# Patient Record
Sex: Male | Born: 1950
Health system: Southern US, Community
[De-identification: ages and names within clinical notes are randomized; demographics above are authoritative.]

## PROBLEM LIST (undated history)

## (undated) DIAGNOSIS — K7460 Unspecified cirrhosis of liver: Secondary | ICD-10-CM

## (undated) DIAGNOSIS — I10 Essential (primary) hypertension: Secondary | ICD-10-CM

## (undated) DIAGNOSIS — B1920 Unspecified viral hepatitis C without hepatic coma: Secondary | ICD-10-CM

## (undated) DIAGNOSIS — E119 Type 2 diabetes mellitus without complications: Secondary | ICD-10-CM

## (undated) HISTORY — PX: APPENDECTOMY: SHX54

## (undated) HISTORY — PX: SHOULDER ARTHROSCOPY: SHX128

---

## 2003-02-09 ENCOUNTER — Emergency Department (HOSPITAL_COMMUNITY): Admission: EM | Admit: 2003-02-09 | Discharge: 2003-02-09 | Payer: Self-pay | Admitting: Emergency Medicine

## 2006-04-30 ENCOUNTER — Emergency Department (HOSPITAL_COMMUNITY): Admission: EM | Admit: 2006-04-30 | Discharge: 2006-05-01 | Payer: Self-pay | Admitting: Emergency Medicine

## 2006-05-04 ENCOUNTER — Emergency Department (HOSPITAL_COMMUNITY): Admission: EM | Admit: 2006-05-04 | Discharge: 2006-05-04 | Payer: Self-pay | Admitting: Emergency Medicine

## 2007-11-29 ENCOUNTER — Ambulatory Visit: Payer: Self-pay | Admitting: Gastroenterology

## 2008-02-14 ENCOUNTER — Ambulatory Visit: Payer: Self-pay | Admitting: Gastroenterology

## 2008-02-20 ENCOUNTER — Ambulatory Visit (HOSPITAL_COMMUNITY): Admission: RE | Admit: 2008-02-20 | Discharge: 2008-02-20 | Payer: Self-pay | Admitting: Gastroenterology

## 2008-08-07 ENCOUNTER — Ambulatory Visit: Payer: Self-pay | Admitting: Gastroenterology

## 2008-10-16 ENCOUNTER — Ambulatory Visit (HOSPITAL_COMMUNITY): Admission: RE | Admit: 2008-10-16 | Discharge: 2008-10-16 | Payer: Self-pay | Admitting: Gastroenterology

## 2010-05-24 ENCOUNTER — Encounter: Payer: Self-pay | Admitting: Gastroenterology

## 2010-08-09 LAB — CREATININE, SERUM
Creatinine, Ser: 0.86 mg/dL (ref 0.4–1.5)
GFR calc Af Amer: >60 mL/min (ref >60)
GFR calc non Af Amer: >60 mL/min (ref >60)

## 2015-01-26 ENCOUNTER — Emergency Department (HOSPITAL_COMMUNITY)
Admission: EM | Admit: 2015-01-26 | Discharge: 2015-01-26 | Disposition: A | Discharge status: Home / Self Care | Payer: Medicare Other | Attending: Emergency Medicine | Admitting: Emergency Medicine

## 2015-01-26 ENCOUNTER — Emergency Department (HOSPITAL_COMMUNITY): Payer: Medicare Other

## 2015-01-26 ENCOUNTER — Encounter (HOSPITAL_COMMUNITY): Payer: Self-pay

## 2015-01-26 DIAGNOSIS — R160 Hepatomegaly, not elsewhere classified: Secondary | ICD-10-CM | POA: Insufficient documentation

## 2015-01-26 DIAGNOSIS — Z79899 Other long term (current) drug therapy: Secondary | ICD-10-CM | POA: Insufficient documentation

## 2015-01-26 DIAGNOSIS — R21 Rash and other nonspecific skin eruption: Secondary | ICD-10-CM | POA: Insufficient documentation

## 2015-01-26 DIAGNOSIS — Z794 Long term (current) use of insulin: Secondary | ICD-10-CM | POA: Diagnosis not present

## 2015-01-26 DIAGNOSIS — Z87891 Personal history of nicotine dependence: Secondary | ICD-10-CM | POA: Insufficient documentation

## 2015-01-26 DIAGNOSIS — E119 Type 2 diabetes mellitus without complications: Secondary | ICD-10-CM | POA: Insufficient documentation

## 2015-01-26 DIAGNOSIS — R1031 Right lower quadrant pain: Secondary | ICD-10-CM | POA: Insufficient documentation

## 2015-01-26 DIAGNOSIS — Z8719 Personal history of other diseases of the digestive system: Secondary | ICD-10-CM | POA: Insufficient documentation

## 2015-01-26 DIAGNOSIS — H539 Unspecified visual disturbance: Secondary | ICD-10-CM | POA: Insufficient documentation

## 2015-01-26 DIAGNOSIS — R109 Unspecified abdominal pain: Secondary | ICD-10-CM | POA: Diagnosis present

## 2015-01-26 DIAGNOSIS — Z8619 Personal history of other infectious and parasitic diseases: Secondary | ICD-10-CM | POA: Insufficient documentation

## 2015-01-26 DIAGNOSIS — R1011 Right upper quadrant pain: Secondary | ICD-10-CM | POA: Diagnosis not present

## 2015-01-26 DIAGNOSIS — Z9049 Acquired absence of other specified parts of digestive tract: Secondary | ICD-10-CM | POA: Insufficient documentation

## 2015-01-26 HISTORY — DX: Type 2 diabetes mellitus without complications: E11.9

## 2015-01-26 HISTORY — DX: Unspecified viral hepatitis C without hepatic coma: B19.20

## 2015-01-26 HISTORY — DX: Unspecified cirrhosis of liver: K74.60

## 2015-01-26 LAB — CBC WITH DIFFERENTIAL/PLATELET
Basophils Absolute: 0.1 10*3/uL (ref 0.0–0.1)
Basophils Relative: 1 %
EOS PCT: 4 %
Eosinophils Absolute: 0.3 10*3/uL (ref 0.0–0.7)
HEMATOCRIT: 40.2 % (ref 39.0–52.0)
HEMOGLOBIN: 13.8 g/dL (ref 13.0–17.0)
LYMPHS ABS: 2.8 10*3/uL (ref 0.7–4.0)
LYMPHS PCT: 37 %
MCH: 30.1 pg (ref 26.0–34.0)
MCHC: 34.3 g/dL (ref 30.0–36.0)
MCV: 87.8 fL (ref 78.0–100.0)
Monocytes Absolute: 0.7 10*3/uL (ref 0.1–1.0)
Monocytes Relative: 10 %
NEUTROS ABS: 3.8 10*3/uL (ref 1.7–7.7)
Neutrophils Relative %: 48 %
PLATELETS: 319 10*3/uL (ref 150–400)
RBC: 4.58 MIL/uL (ref 4.22–5.81)
RDW: 15 % (ref 11.5–15.5)
WBC: 7.6 10*3/uL (ref 4.0–10.5)

## 2015-01-26 LAB — HEPATIC FUNCTION PANEL
ALBUMIN: 3.4 g/dL — AB (ref 3.5–5.0)
ALT: 121 U/L — ABNORMAL HIGH (ref 17–63)
AST: 161 U/L — AB (ref 15–41)
Alkaline Phosphatase: 356 U/L — ABNORMAL HIGH (ref 38–126)
BILIRUBIN TOTAL: 1.5 mg/dL — AB (ref 0.3–1.2)
Bilirubin, Direct: 0.7 mg/dL — ABNORMAL HIGH (ref 0.1–0.5)
Indirect Bilirubin: 0.8 mg/dL (ref 0.3–0.9)
Total Protein: 9.3 g/dL — ABNORMAL HIGH (ref 6.5–8.1)

## 2015-01-26 LAB — URINALYSIS, ROUTINE W REFLEX MICROSCOPIC
Bilirubin Urine: NEGATIVE
Glucose, UA: NEGATIVE mg/dL
Hgb urine dipstick: NEGATIVE
Ketones, ur: NEGATIVE mg/dL
LEUKOCYTES UA: NEGATIVE
NITRITE: NEGATIVE
PROTEIN: NEGATIVE mg/dL
SPECIFIC GRAVITY, URINE: 1.016 (ref 1.005–1.030)
UROBILINOGEN UA: 1 mg/dL (ref 0.0–1.0)
pH: 5 (ref 5.0–8.0)

## 2015-01-26 LAB — BASIC METABOLIC PANEL
ANION GAP: 9 (ref 5–15)
BUN: 27 mg/dL — ABNORMAL HIGH (ref 6–20)
CALCIUM: 9.8 mg/dL (ref 8.9–10.3)
CO2: 21 mmol/L — ABNORMAL LOW (ref 22–32)
CREATININE: 1.49 mg/dL — AB (ref 0.61–1.24)
Chloride: 105 mmol/L (ref 101–111)
GFR, EST AFRICAN AMERICAN: 55 mL/min — AB (ref >60)
GFR, EST NON AFRICAN AMERICAN: 48 mL/min — AB (ref >60)
Glucose, Bld: 141 mg/dL — ABNORMAL HIGH (ref 65–99)
Potassium: 4.3 mmol/L (ref 3.5–5.1)
SODIUM: 135 mmol/L (ref 135–145)

## 2015-01-26 LAB — PROTIME-INR
INR: 1.05 (ref 0.00–1.49)
PROTHROMBIN TIME: 13.9 s (ref 11.6–15.2)

## 2015-01-26 LAB — LIPASE, BLOOD: Lipase: 29 U/L (ref 22–51)

## 2015-01-26 LAB — TROPONIN I: Troponin I: <0.03 ng/mL (ref <0.031)

## 2015-01-26 MED ORDER — HYDROXYZINE HCL 25 MG PO TABS: 25.0000 mg | ORAL_TABLET | Freq: Four times a day (QID) | ORAL | Status: DC | PRN

## 2015-01-26 MED ORDER — TRAMADOL HCL 50 MG PO TABS: 50.0000 mg | ORAL_TABLET | Freq: Four times a day (QID) | ORAL | Status: DC | PRN

## 2015-01-26 NOTE — Discharge Instructions (Signed)
Your ultrasound shows the possibility of a mass in your liver. Your doctor will need to order an MRI with and without contrast to further evaluate. There is a chance that this is cancer in her liver and needs to be followed up quickly.  Abdominal Pain Many things can cause abdominal pain. Usually, abdominal pain is not caused by a disease and will improve without treatment. It can often be observed and treated at home. Your health care provider will do a physical exam and possibly order blood tests and X-rays to help determine the seriousness of your pain. However, in many cases, more time must pass before a clear cause of the pain can be found. Before that point, your health care provider may not know if you need more testing or further treatment. HOME CARE INSTRUCTIONS  Monitor your abdominal pain for any changes. The following actions may help to alleviate any discomfort you are experiencing:  Only take over-the-counter or prescription medicines as directed by your health care provider.  Do not take laxatives unless directed to do so by your health care provider.  Try a clear liquid diet (broth, tea, or water) as directed by your health care provider. Slowly move to a bland diet as tolerated. SEEK MEDICAL CARE IF:  You have unexplained abdominal pain.  You have abdominal pain associated with nausea or diarrhea.  You have pain when you urinate or have a bowel movement.  You experience abdominal pain that wakes you in the night.  You have abdominal pain that is worsened or improved by eating food.  You have abdominal pain that is worsened with eating fatty foods.  You have a fever. SEEK IMMEDIATE MEDICAL CARE IF:   Your pain does not go away within 2 hours.  You keep throwing up (vomiting).  Your pain is felt only in portions of the abdomen, such as the right side or the left lower portion of the abdomen.  You pass bloody or black tarry stools. MAKE SURE YOU:  Understand these  instructions.   Will watch your condition.   Will get help right away if you are not doing well or get worse.  Document Released: 01/26/2005 Document Revised: 04/23/2013 Document Reviewed: 12/26/2012 [facility] Patient Information 2015 [place], [place]. This information is not intended to replace advice given to you by your health care provider. Make sure you discuss any questions you have with your health care provider. Liver Profile A liver profile is a battery of tests which helps your caregiver evaluate your liver function. The following tests are often included in the liver profile: Alanine aminotransferase (ALT or SGPT) This is an enzyme found primarily in the liver. Abnormalities may represent liver disease. This is found in cells of the liver so when it is elevated, it has been released by damaged cells. Albumin - The serum albumin is one of the major proteins in the blood and a reflection of the general state of nutrition. This is low when the liver is unable to do its job. It is also low when protein is lost in the urine. NORMAL FINDINGS Adult/Elderly  Total protein: 6.4-8.3 g/dL or 56-21 g/L (SI units)  Albumin: 3.5-5 g/dL or 30-86 g/L (SI units)  Globulin: 2.3-3.4 g/dL  Alpha1 globulin: 5.7-8.4 g/dL or 1-3 g/L (SI units)  Alpha2 globulin: 0.6-1 g/dL or 6-96 g/L (SI units)  Beta globulin: 0.7-1.1 g/dL or 2-95 g/L (SI units) Children  Total protein  Premature infant: 4.2-7.6 g/dL  Newborn: 2.8-4.1 g/dL  Infant: 3-2.4  g/dL  Child: 1.6-1 g/dL  Albumin  Premature infant: 3-4.2 g/dL  Newborn: 0.9-6.0 g/dL  Infant: 4.5-4.0 g/dL  Child: 9-8.1 g/dL Albumin/Globulin ratio - Calculated by dividing the albumin by the globulin. It is a measure of well being.  Alkaline phosphatase - This is an enzyme which is important in diagnosing proper bone and liver functions. NORMAL FINDINGS Age / Normal Value (units/L)  0-5 days / 35-140  Less than 3 yr / 15-60  3-6 yr /  15-50  6-12 yr / 10-50  12-18 yr / 10-40  Adult / 0-35 units/L or 0-0.58 microKat/L (SI Units) (Females tend to have slightly lower levels than males)  Elderly / Slightly higher than adults Aspartate aminotransferase (AST or SGOT) - an enzyme found in skeletal and heart muscle, liver and other organs. Abnormalities may represent liver disease. This is found in cells of the liver so when it is elevated, it has been released by damaged cells. Bilirubin, Total: A chemical involved with liver functions. High concentrations may result in jaundice. Jaundice is a yellowing of the skin and the whites of the eyes. NORMAL FINDINGS Blood  Adult/elderly/child  Total bilirubin: 0.3-1.0 mg/dL or 1.9-14 micromole/L (SI units)  Indirect bilirubin: 0.2-0.8 mg/dL or 7.8-29.5 micromole/L (SI units)  Direct bilirubin: 0.1-0.3 mg/dL or 6.2-1.3 micromole/L (SI units)  Newborn total bilirubin: 1.0-12.0 mg/dL or 08.6-578 micromole/L (SI units)  Urine0-0.02 mg/dL Ranges for normal findings may vary among different laboratories and hospitals. You should always check with your doctor after having lab work or other tests done to discuss the meaning of your test results and whether your values are considered within normal limits PREPARATION FOR TEST No preparation or fasting is necessary unless you have been informed otherwise. A blood sample is obtained by inserting a needle into a vein in the arm. MEANING OF TEST  Your caregiver will go over the test results with you and discuss the importance and meaning of your results, as well as treatment options and the need for additional tests if necessary. OBTAINING THE TEST RESULTS It is your responsibility to obtain your test results. Ask the lab or department performing the test when and how you will get your results. Document Released: 05/21/2004 Document Revised: 07/11/2011 Document Reviewed: 08/20/2013 [facility] Patient Information 2015 [place], [place]. This  information is not intended to replace advice given to you by your health care provider. Make sure you discuss any questions you have with your health care provider.

## 2015-01-26 NOTE — ED Provider Notes (Signed)
CSN: [id number]     Arrival date & time 01/26/15  0749 History   First MD Initiated Contact with Patient 01/26/15 0802     Chief Complaint  Patient presents with  . Abdominal Pain     (Consider location/radiation/quality/duration/timing/severity/associated sxs/prior Treatment) HPI Comments: Patient presents to the ER with multiple complaints. Patient reports that he has been having flashes in front of his eyes that seem like gnats flying around him. It happens for seconds and then goes away. He has not identified anything that has caused this. He is not experiencing blurred or double vision. There is no associated headache.  Patient also reports that he has been noticing increased skin tags and they're itchy.  Patient also complaining of abdominal pain. He has been having pain on the right side of his abdomen for 5 weeks. No associated fever, nausea, vomiting, diarrhea or constipation. He reports a history of hepatitis see, but it "is not active". Patient recently was told that his LFTs were elevated by his doctor. He was also told that his kidneys are strained. His doctor stopped his metformin because of this.  Patient is a 64 y.o. male presenting with abdominal pain.  Abdominal Pain   Past Medical History  Diagnosis Date  . Diabetes mellitus without complication   . Hepatitis C   . Cirrhosis of liver    Past Surgical History  Procedure Laterality Date  . Appendectomy    . Shoulder arthroscopy     No family history on file. Social History  Substance Use Topics  . Smoking status: Former Games developer  . Smokeless tobacco: None  . Alcohol Use: No    Review of Systems  Eyes: Positive for visual disturbance.  Gastrointestinal: Positive for abdominal pain.  Skin: Positive for rash.  All other systems reviewed and are negative.     Allergies  Review of patient's allergies indicates no known allergies.  Home Medications   Prior to Admission medications   Medication Sig Start  Date End Date Taking? Authorizing Provider  hydrOXYzine (VISTARIL) 25 MG capsule Take 25 mg by mouth 3 (three) times daily. 01/21/15  Yes Historical Provider, MD  LANTUS SOLOSTAR 100 UNIT/ML Solostar Pen Inject 60-80 Units into the skin 2 (two) times daily. 80 units in the morning, 60 units in the evening 01/01/15  Yes Historical Provider, MD  lisinopril-hydrochlorothiazide (PRINZIDE,ZESTORETIC) 20-25 MG per tablet Take 1 tablet by mouth daily. 01/01/15  Yes Historical Provider, MD  LYRICA 100 MG capsule Take 100 mg by mouth 3 (three) times daily. 01/15/15  Yes Historical Provider, MD  omeprazole (PRILOSEC) 40 MG capsule Take 40 mg by mouth daily. 01/14/15  Yes Historical Provider, MD   BP 123/80 mmHg  Pulse 80  Temp(Src) 97.7 F (36.5 C) (Oral)  Resp 17  SpO2 99% Physical Exam  Constitutional: He is oriented to person, place, and time. He appears well-developed and well-nourished. No distress.  HENT:  Head: Normocephalic and atraumatic.  Right Ear: Hearing normal.  Left Ear: Hearing normal.  Nose: Nose normal.  Mouth/Throat: Oropharynx is clear and moist and mucous membranes are normal.  Eyes: Conjunctivae and EOM are normal. Pupils are equal, round, and reactive to light.  Neck: Normal range of motion. Neck supple.  Cardiovascular: Regular rhythm, S1 normal and S2 normal.  Exam reveals no gallop and no friction rub.   No murmur heard. Pulmonary/Chest: Effort normal and breath sounds normal. No respiratory distress. He exhibits no tenderness.  Abdominal: Soft. Normal appearance and bowel sounds are  normal. There is no hepatosplenomegaly. There is tenderness in the right upper quadrant and right lower quadrant. There is no rebound, no guarding, no tenderness at McBurney's point and negative Murphy's sign. No hernia.  Musculoskeletal: Normal range of motion.  Neurological: He is alert and oriented to person, place, and time. He has normal strength. No cranial nerve deficit or sensory deficit.  Coordination normal. GCS eye subscore is 4. GCS verbal subscore is 5. GCS motor subscore is 6.  Skin: Skin is warm, dry and intact. No rash noted. No cyanosis.  Psychiatric: He has a normal mood and affect. His speech is normal and behavior is normal. Thought content normal.  Nursing note and vitals reviewed.   ED Course  Procedures (including critical care time) Labs Review Labs Reviewed  BASIC METABOLIC PANEL - Abnormal; Notable for the following:    CO2 21 (*)    Glucose, Bld 141 (*)    BUN 27 (*)    Creatinine, Ser 1.49 (*)    GFR calc non Af Amer 48 (*)    GFR calc Af Amer 55 (*)    All other components within normal limits  HEPATIC FUNCTION PANEL - Abnormal; Notable for the following:    Total Protein 9.3 (*)    Albumin 3.4 (*)    AST 161 (*)    ALT 121 (*)    Alkaline Phosphatase 356 (*)    Total Bilirubin 1.5 (*)    Bilirubin, Direct 0.7 (*)    All other components within normal limits  CBC WITH DIFFERENTIAL/PLATELET  TROPONIN I  LIPASE, BLOOD  URINALYSIS, ROUTINE W REFLEX MICROSCOPIC (NOT AT [facility])  PROTIME-INR    Imaging Review US Abdomen Limited Ruq  01/26/2015   CLINICAL DATA:  Elevated liver enzymes  EXAM: US ABDOMEN LIMITED - RIGHT UPPER QUADRANT  COMPARISON:  Abdominal ultrasound February 20, 2008; abdominal MRI October 16, 2008  FINDINGS: Gallbladder:  No gallstones or wall thickening visualized. There is no pericholecystic fluid. No sonographic Murphy sign noted.  Common bile duct:  Diameter: 5 mm. There is no intrahepatic or extrahepatic biliary duct dilatation.  Liver:  There is a solid mass arising in the caudate lobe region measuring 4.7 x 3.5 x 3.1 cm. Liver has a somewhat nodular contour with a mildly increased echotexture, concerning for underlying cirrhosis.  IMPRESSION: Mass arising from caudate lobe of liver. Underlying changes concerning for parenchymal disease/cirrhosis. Hepatocellular carcinoma must be of concern given these findings. Would advise pre and  and multiphasic post-contrast MR or CT of the liver to further evaluate. Study otherwise unremarkable.  These results were called by telephone at the time of interpretation on 01/26/2015 at 10:49 am to Dr. Jaci Carrel , who verbally acknowledged these results.   Electronically Signed   By: Bretta Bang III M.D.   On: 01/26/2015 10:50   I have personally reviewed and evaluated these images and lab results as part of my medical decision-making.   EKG Interpretation   Date/Time:  Monday January 26 2015 08:27:48 EDT Ventricular Rate:  76 PR Interval:  156 QRS Duration: 98 QT Interval:  407 QTC Calculation: 458 R Axis:   32 Text Interpretation:  Sinus rhythm Normal ECG Confirmed by Dawan Farney  MD,  Gennesis Hogland ([id number]) on 01/26/2015 9:36:02 AM      MDM   Final diagnoses:  None   abdominal pain Liver mass Pruritis  Presents to the ER for evaluation of multiple problems. Patient complaining of right upper abdominal pain and has been  told that his LFTs were elevated recently. Patient does report a history of hepatitis C. This has resulted in cirrhosis of the liver. LFTs were elevated here in the ER. He did have tenderness in the right upper quadrant. Ultrasound performed and does show evidence of mass arising from the caudate lobe of the liver. This is likely causing the patient's symptoms. I did discuss with him the possibility of liver cancer. He understands he needs prompt follow-up. He does see Dr. Girard Cooter at [facility]. He is to call her today for follow-up and arrange for outpatient MRI.     Gilda Crease, MD 01/26/15 1128

## 2015-01-26 NOTE — ED Notes (Signed)
Pt. Presents with complaint of RLQ abd pain x 5weeks. Pt. With hx of hepatitis C and possible liver cirrhosis. Pt. Ambulatory to room with EMS. Pt. AxO x4.

## 2015-05-18 ENCOUNTER — Emergency Department (HOSPITAL_BASED_OUTPATIENT_CLINIC_OR_DEPARTMENT_OTHER): Payer: Medicare Other

## 2015-05-18 ENCOUNTER — Encounter (HOSPITAL_BASED_OUTPATIENT_CLINIC_OR_DEPARTMENT_OTHER): Payer: Self-pay | Admitting: *Deleted

## 2015-05-18 ENCOUNTER — Emergency Department (HOSPITAL_BASED_OUTPATIENT_CLINIC_OR_DEPARTMENT_OTHER)
Admission: EM | Admit: 2015-05-18 | Discharge: 2015-05-18 | Disposition: A | Discharge status: Home / Self Care | Payer: Medicare Other | Attending: Emergency Medicine | Admitting: Emergency Medicine

## 2015-05-18 DIAGNOSIS — G8929 Other chronic pain: Secondary | ICD-10-CM | POA: Diagnosis not present

## 2015-05-18 DIAGNOSIS — E119 Type 2 diabetes mellitus without complications: Secondary | ICD-10-CM | POA: Diagnosis not present

## 2015-05-18 DIAGNOSIS — N189 Chronic kidney disease, unspecified: Secondary | ICD-10-CM | POA: Diagnosis not present

## 2015-05-18 DIAGNOSIS — R109 Unspecified abdominal pain: Secondary | ICD-10-CM | POA: Diagnosis present

## 2015-05-18 DIAGNOSIS — S90812D Abrasion, left foot, subsequent encounter: Secondary | ICD-10-CM | POA: Insufficient documentation

## 2015-05-18 DIAGNOSIS — K7460 Unspecified cirrhosis of liver: Secondary | ICD-10-CM | POA: Insufficient documentation

## 2015-05-18 DIAGNOSIS — Z87891 Personal history of nicotine dependence: Secondary | ICD-10-CM | POA: Insufficient documentation

## 2015-05-18 DIAGNOSIS — R350 Frequency of micturition: Secondary | ICD-10-CM | POA: Diagnosis not present

## 2015-05-18 DIAGNOSIS — X58XXXD Exposure to other specified factors, subsequent encounter: Secondary | ICD-10-CM | POA: Insufficient documentation

## 2015-05-18 DIAGNOSIS — Z794 Long term (current) use of insulin: Secondary | ICD-10-CM | POA: Diagnosis not present

## 2015-05-18 DIAGNOSIS — Z79899 Other long term (current) drug therapy: Secondary | ICD-10-CM | POA: Diagnosis not present

## 2015-05-18 LAB — CBC WITH DIFFERENTIAL/PLATELET
Basophils Absolute: 0 10*3/uL (ref 0.0–0.1)
Basophils Relative: 0 %
Eosinophils Absolute: 0.4 10*3/uL (ref 0.0–0.7)
Eosinophils Relative: 4 %
HEMATOCRIT: 33.6 % — AB (ref 39.0–52.0)
HEMOGLOBIN: 10.7 g/dL — AB (ref 13.0–17.0)
LYMPHS ABS: 2.7 10*3/uL (ref 0.7–4.0)
LYMPHS PCT: 28 %
MCH: 27.9 pg (ref 26.0–34.0)
MCHC: 31.8 g/dL (ref 30.0–36.0)
MCV: 87.5 fL (ref 78.0–100.0)
MONOS PCT: 13 %
Monocytes Absolute: 1.3 10*3/uL — ABNORMAL HIGH (ref 0.1–1.0)
NEUTROS ABS: 5.4 10*3/uL (ref 1.7–7.7)
NEUTROS PCT: 55 %
Platelets: 305 10*3/uL (ref 150–400)
RBC: 3.84 MIL/uL — ABNORMAL LOW (ref 4.22–5.81)
RDW: 15.9 % — ABNORMAL HIGH (ref 11.5–15.5)
WBC: 9.8 10*3/uL (ref 4.0–10.5)

## 2015-05-18 LAB — URINALYSIS, ROUTINE W REFLEX MICROSCOPIC
BILIRUBIN URINE: NEGATIVE
GLUCOSE, UA: NEGATIVE mg/dL
Hgb urine dipstick: NEGATIVE
Ketones, ur: NEGATIVE mg/dL
NITRITE: NEGATIVE
PH: 6 (ref 5.0–8.0)
Protein, ur: NEGATIVE mg/dL
SPECIFIC GRAVITY, URINE: 1.014 (ref 1.005–1.030)

## 2015-05-18 LAB — URINE MICROSCOPIC-ADD ON
Bacteria, UA: NONE SEEN
RBC / HPF: NONE SEEN RBC/hpf (ref 0–5)

## 2015-05-18 LAB — COMPREHENSIVE METABOLIC PANEL
ALBUMIN: 3 g/dL — AB (ref 3.5–5.0)
ALT: 54 U/L (ref 17–63)
ANION GAP: 5 (ref 5–15)
AST: 60 U/L — AB (ref 15–41)
Alkaline Phosphatase: 440 U/L — ABNORMAL HIGH (ref 38–126)
BUN: 23 mg/dL — ABNORMAL HIGH (ref 6–20)
CO2: 23 mmol/L (ref 22–32)
Calcium: 9.1 mg/dL (ref 8.9–10.3)
Chloride: 107 mmol/L (ref 101–111)
Creatinine, Ser: 1.62 mg/dL — ABNORMAL HIGH (ref 0.61–1.24)
GFR calc Af Amer: 50 mL/min — ABNORMAL LOW (ref >60)
GFR calc non Af Amer: 43 mL/min — ABNORMAL LOW (ref >60)
GLUCOSE: 167 mg/dL — AB (ref 65–99)
POTASSIUM: 3.7 mmol/L (ref 3.5–5.1)
SODIUM: 135 mmol/L (ref 135–145)
Total Bilirubin: 1.2 mg/dL (ref 0.3–1.2)
Total Protein: 8.5 g/dL — ABNORMAL HIGH (ref 6.5–8.1)

## 2015-05-18 LAB — LIPASE, BLOOD: Lipase: 60 U/L — ABNORMAL HIGH (ref 11–51)

## 2015-05-18 MED ORDER — OXYCODONE HCL 5 MG PO TABS: 5.0000 mg | ORAL_TABLET | Freq: Four times a day (QID) | ORAL | Status: DC | PRN

## 2015-05-18 MED ORDER — OXYCODONE HCL 5 MG PO TABS
5.0000 mg | ORAL_TABLET | ORAL | Status: AC
  Administered 2015-05-18: 5 mg via ORAL
  Filled 2015-05-18: qty 1

## 2015-05-18 NOTE — ED Notes (Signed)
Warm blanket given

## 2015-05-18 NOTE — Discharge Instructions (Signed)
Read the information below.  Use the prescribed medication as directed.  Please discuss all new medications with your pharmacist.  You may return to the Emergency Department at any time for worsening condition or any new symptoms that concern you.    If you develop high fevers, worsening abdominal pain, uncontrolled vomiting, or are unable to tolerate fluids by mouth, return to the ER for a recheck.   Please follow up with your primary care provider to discuss pain management.     Abdominal Pain, Adult Many things can cause belly (abdominal) pain. Most times, the belly pain is not dangerous. Many cases of belly pain can be watched and treated at home. HOME CARE   Do not take medicines that help you go poop (laxatives) unless told to by your doctor.  Only take medicine as told by your doctor.  Eat or drink as told by your doctor. Your doctor will tell you if you should be on a special diet. GET HELP IF:  You do not know what is causing your belly pain.  You have belly pain while you are sick to your stomach (nauseous) or have runny poop (diarrhea).  You have pain while you pee or poop.  Your belly pain wakes you up at night.  You have belly pain that gets worse or better when you eat.  You have belly pain that gets worse when you eat fatty foods.  You have a fever. GET HELP RIGHT AWAY IF:   The pain does not go away within 2 hours.  You keep throwing up (vomiting).  The pain changes and is only in the right or left part of the belly.  You have bloody or tarry looking poop. MAKE SURE YOU:   Understand these instructions.  Will watch your condition.  Will get help right away if you are not doing well or get worse.   This information is not intended to replace advice given to you by your health care provider. Make sure you discuss any questions you have with your health care provider.   Document Released: 10/05/2007 Document Revised: 05/09/2014 Document Reviewed:  12/26/2012 Elsevier Interactive Patient Education 2016 [employer]  Chronic Kidney Disease Chronic kidney disease happens when the kidneys are damaged over a long period. The kidneys are two organs that do many important jobs in the body. These jobs include:  Removing wastes and extra fluids from the blood.  Making hormones that help to keep the body healthy.  Making sure that the body has the right amount of fluids and chemicals. Chronic kidney disease may be caused by many things. The kidney damage occurs slowly. If too much damage occurs, the kidneys may stop working the way that they should. This is dangerous. Treatment can help to slow down the damage and keep it from getting worse. HOME CARE  Follow your diet as told by your doctor. You may need to limit the amount of salt (sodium) and protein that you eat each day.  Take medicines only as told by your doctor. Do not take any new medicines unless your doctor approves it.  Quit smoking if you smoke. Talk to your doctor about programs that may help you quit smoking.  Have your blood pressure checked regularly and keep track of the results.  Start or keep doing an exercise plan.  Get shots (immunizations) as told by your doctor.  Take vitamins and minerals as told by your doctor.  Keep all follow-up visits as told by your  doctor. This is important. GET HELP RIGHT AWAY IF:   Your symptoms get worse.  You have new symptoms.  You have symptoms of end-stage kidney disease. These include:  Headaches.  Skin that is darker or lighter than normal.  Numbness in the hands or feet.  Easy bruising.  Frequent hiccups.  Stopping of menstrual periods in women.  You have a fever.  You are making very little pee (urine).  You have pain or bleeding when you pee.   This information is not intended to replace advice given to you by your health care provider. Make sure you discuss any questions you have with your health care  provider.   Document Released: 07/13/2009 Document Revised: 01/07/2015 Document Reviewed: 12/16/2011 Elsevier Interactive Patient Education 2016 [employer]  Chronic Pain Chronic pain can be defined as pain that is off and on and lasts for 3-6 months or longer. Many things cause chronic pain, which can make it difficult to make a diagnosis. There are many treatment options available for chronic pain. However, finding a treatment that works well for you may require trying various approaches until the right one is found. Many people benefit from a combination of two or more types of treatment to control their pain. SYMPTOMS  Chronic pain can occur anywhere in the body and can range from mild to very severe. Some types of chronic pain include:  Headache.  Low back pain.  Cancer pain.  Arthritis pain.  Neurogenic pain. This is pain resulting from damage to nerves. People with chronic pain may also have other symptoms such as:  Depression.  Anger.  Insomnia.  Anxiety. DIAGNOSIS  Your health care provider will help diagnose your condition over time. In many cases, the initial focus will be on excluding possible conditions that could be causing the pain. Depending on your symptoms, your health care provider may order tests to diagnose your condition. Some of these tests may include:   Blood tests.   CT scan.   MRI.   X-rays.   Ultrasounds.   Nerve conduction studies.  You may need to see a specialist.  TREATMENT  Finding treatment that works well may take time. You may be referred to a pain specialist. He or she may prescribe medicine or therapies, such as:   Mindful meditation or yoga.  Shots (injections) of numbing or pain-relieving medicines into the spine or area of pain.  Local electrical stimulation.  Acupuncture.   Massage therapy.   Aroma, color, light, or sound therapy.   Biofeedback.   Working with a physical therapist to keep from getting  stiff.   Regular, gentle exercise.   Cognitive or behavioral therapy.   Group support.  Sometimes, surgery may be recommended.  HOME CARE INSTRUCTIONS   Take all medicines as directed by your health care provider.   Lessen stress in your life by relaxing and doing things such as listening to calming music.   Exercise or be active as directed by your health care provider.   Eat a healthy diet and include things such as vegetables, fruits, fish, and lean meats in your diet.   Keep all follow-up appointments with your health care provider.   Attend a support group with others suffering from chronic pain. SEEK MEDICAL CARE IF:   Your pain gets worse.   You develop a new pain that was not there before.   You cannot tolerate medicines given to you by your health care provider.   You have new symptoms  since your last visit with your health care provider.  SEEK IMMEDIATE MEDICAL CARE IF:   You feel weak.   You have decreased sensation or numbness.   You lose control of bowel or bladder function.   Your pain suddenly gets much worse.   You develop shaking.  You develop chills.  You develop confusion.  You develop chest pain.  You develop shortness of breath.  MAKE SURE YOU:  Understand these instructions.  Will watch your condition.  Will get help right away if you are not doing well or get worse.   This information is not intended to replace advice given to you by your health care provider. Make sure you discuss any questions you have with your health care provider.   Document Released: 01/08/2002 Document Revised: 12/19/2012 Document Reviewed: 10/12/2012 Elsevier Interactive Patient Education [employer]

## 2015-05-18 NOTE — ED Provider Notes (Signed)
CSN: [id number]     Arrival date & time 05/18/15  4098 History   First MD Initiated Contact with Patient 05/18/15 2017     Chief Complaint  Patient presents with  . Urinary Tract Infection     (Consider location/radiation/quality/duration/timing/severity/associated sxs/prior Treatment) The history is provided by the patient and medical records.    Pt with hx DM, Hep C, cirrhosis, liver mass presents with worsening bilateral abdominal/flank pain, ongoing 3 months, gradually worsening and significantly worse over the past 5 days.  Is aching in nature. Takes no medication for this.  Is also having urinary frequency, urinating every 5-10 minutes.  Has been treated for recurrent UTIs recently, has been on antibiotics most recently for the past 4 days.  Has been told his kidneys may be failing and he may have to go on dialysis.  Is followed at [facility] for liver mass, pt states they need to do further testing but for now it is "just a mass a not cancer"  Since he has "been sick" (3 months) he has gotten out of breath more frequently.  Feels his "(liver) enzymes are going from my feet around my sides and all over my body."  Denies fevers, chills, myalgias, CP, cough, N/V, diarrhea, bloody stool.  Does have constipation, takes stool softener ever 3-4 days.     Past Medical History  Diagnosis Date  . Diabetes mellitus without complication (HCC)   . Hepatitis C   . Cirrhosis of liver [facility])    Past Surgical History  Procedure Laterality Date  . Appendectomy    . Shoulder arthroscopy     History reviewed. No pertinent family history. Social History  Substance Use Topics  . Smoking status: Former Games developer  . Smokeless tobacco: None  . Alcohol Use: No    Review of Systems  All other systems reviewed and are negative.     Allergies  Review of patient's allergies indicates no known allergies.  Home Medications   Prior to Admission medications   Medication Sig Start Date End  Date Taking? Authorizing Provider  hydrOXYzine (ATARAX/VISTARIL) 25 MG tablet Take 1 tablet (25 mg total) by mouth every 6 (six) hours as needed for itching. 01/26/15   Gilda Crease, MD  hydrOXYzine (VISTARIL) 25 MG capsule Take 25 mg by mouth 3 (three) times daily. 01/21/15   Historical Provider, MD  LANTUS SOLOSTAR 100 UNIT/ML Solostar Pen Inject 60-80 Units into the skin 2 (two) times daily. 80 units in the morning, 60 units in the evening 01/01/15   Historical Provider, MD  lisinopril-hydrochlorothiazide (PRINZIDE,ZESTORETIC) 20-25 MG per tablet Take 1 tablet by mouth daily. 01/01/15   Historical Provider, MD  LYRICA 100 MG capsule Take 100 mg by mouth 3 (three) times daily. 01/15/15   Historical Provider, MD  omeprazole (PRILOSEC) 40 MG capsule Take 40 mg by mouth daily. 01/14/15   Historical Provider, MD  traMADol (ULTRAM) 50 MG tablet Take 1 tablet (50 mg total) by mouth every 6 (six) hours as needed. 01/26/15   Gilda Crease, MD   BP 152/105 mmHg  Pulse 90  Temp(Src) 98.4 F (36.9 C) (Oral)  Resp 20  Ht 5\' 10"  (1.778 m)  Wt 81.647 kg  BMI 25.83 kg/m2  SpO2 100% Physical Exam  Constitutional: He appears well-developed and well-nourished. No distress.  HENT:  Head: Normocephalic and atraumatic.  Neck: Neck supple.  Cardiovascular: Normal rate and regular rhythm.   Pulmonary/Chest: Effort normal and breath sounds normal. No respiratory distress.  He has no wheezes. He has no rales.  Abdominal: Soft. He exhibits distension and mass. There is tenderness. There is no rebound and no guarding.  Neurological: He is alert. He exhibits normal muscle tone.  Skin: He is not diaphoretic.  Left dorsal foot with small abrasion with localized erythema.  No discharge.  No fluctuance or induration.    Nursing note and vitals reviewed.   ED Course  Procedures (including critical care time) Labs Review Labs Reviewed  URINALYSIS, ROUTINE W REFLEX MICROSCOPIC (NOT [facility]) - Abnormal;  Notable for the following:    Leukocytes, UA SMALL (*)    All other components within normal limits  URINE MICROSCOPIC-ADD ON - Abnormal; Notable for the following:    Squamous Epithelial / LPF 0-5 (*)    All other components within normal limits  COMPREHENSIVE METABOLIC PANEL - Abnormal; Notable for the following:    Glucose, Bld 167 (*)    BUN 23 (*)    Creatinine, Ser 1.62 (*)    Total Protein 8.5 (*)    Albumin 3.0 (*)    AST 60 (*)    Alkaline Phosphatase 440 (*)    GFR calc non Af Amer 43 (*)    GFR calc Af Amer 50 (*)    All other components within normal limits  LIPASE, BLOOD - Abnormal; Notable for the following:    Lipase 60 (*)    All other components within normal limits  CBC WITH DIFFERENTIAL/PLATELET - Abnormal; Notable for the following:    RBC 3.84 (*)    Hemoglobin 10.7 (*)    HCT 33.6 (*)    RDW 15.9 (*)    Monocytes Absolute 1.3 (*)    All other components within normal limits  URINE CULTURE    Imaging Review Ct Abdomen Pelvis Wo Contrast  05/18/2015  CLINICAL DATA:  Acute onset of bilateral flank and generalized abdominal pain. Increased urinary frequency. Initial encounter. EXAM: CT ABDOMEN AND PELVIS WITHOUT CONTRAST TECHNIQUE: Multidetector CT imaging of the abdomen and pelvis was performed following the standard protocol without IV contrast. COMPARISON:  CT of the abdomen and pelvis from 03/25/2015, and MRI of the lumbar spine performed 03/28/2015 FINDINGS: The visualized lung bases are clear. There is a diffusely nodular contour to the liver, compatible with hepatic cirrhosis. No definite dominant mass is seen, though evaluation is limited without contrast. The spleen is grossly unremarkable in appearance. A tiny stone is noted within the gallbladder. The gallbladder is relatively decompressed and otherwise unremarkable. The pancreas and adrenal glands are within normal limits. Scattered mild gastric and splenic varices are noted. The pancreas and adrenal  glands are grossly unremarkable in appearance. The kidneys are unremarkable in appearance. There is no evidence of hydronephrosis. No renal or ureteral stones are seen. No perinephric stranding is appreciated. No free fluid is identified. The small bowel is unremarkable in appearance. The stomach is within normal limits. No acute vascular abnormalities are seen. Relatively diffuse calcification is noted along the abdominal aorta and its branches. The patient is status post appendectomy. The colon is unremarkable in appearance. The bladder is mildly distended and grossly unremarkable. The prostate remains normal in size. No inguinal lymphadenopathy is seen. No acute osseous abnormalities are identified. There is mild grade 1 anterolisthesis of L4 on L5, reflecting underlying facet disease. IMPRESSION: 1. No acute abnormality seen to explain the patient's symptoms. 2. Findings of hepatic cirrhosis. 3. Suggestion of mild gastric and splenic varices. 4. Tiny stone noted within the  gallbladder. Gallbladder otherwise unremarkable. 5. Relatively diffuse calcification along the abdominal aorta and its branches. Electronically Signed   By: Roanna Raider M.D.   On: 05/18/2015 22:39     EKG Interpretation None      MDM   Final diagnoses:  Chronic abdominal pain  Chronic kidney disease, unspecified stage  Cirrhosis of liver without ascites, unspecified hepatic cirrhosis type (HCC)    Afebrile, nontoxic patient with chronic abdominal pain, urinary symptoms.  Is on Augmentin by PCP for UTI.  UA does not appear infected, likely partially treated UTI given symptoms.  Pt also has small erythema surrounding foot wound - augmentin should also be treating this.  I have advised pt to follow closely with PCP for recheck. Labs around baseline,and consistent with his chronic conditions.  He does have worsening renal function.  CT without any new significant findings.  D/C home with small quantity of oxycodone, close PCP  follow up.  Discussed result, findings, treatment, and follow up  with patient.  Pt given return precautions.  Pt verbalizes understanding and agrees with plan.         [place], PA-C 05/19/15 0041  Rolan Bucco, MD 05/19/15 [phone]

## 2015-05-18 NOTE — ED Notes (Addendum)
Pt reports bilateral flank pain, frequency x several days.  Hx of DM-reports CBG have been '170s' at home.  Pt has been on antibiotics for UTI x 4 days without relief.

## 2015-05-18 NOTE — ED Notes (Signed)
Patient given gingerale for po challenge.

## 2015-05-20 LAB — URINE CULTURE: Culture: 2000

## 2015-05-28 ENCOUNTER — Encounter (HOSPITAL_BASED_OUTPATIENT_CLINIC_OR_DEPARTMENT_OTHER): Payer: Self-pay | Admitting: *Deleted

## 2015-05-28 ENCOUNTER — Emergency Department (HOSPITAL_BASED_OUTPATIENT_CLINIC_OR_DEPARTMENT_OTHER)
Admission: EM | Admit: 2015-05-28 | Discharge: 2015-05-28 | Disposition: A | Discharge status: Home / Self Care | Payer: Medicare Other | Attending: Emergency Medicine | Admitting: Emergency Medicine

## 2015-05-28 DIAGNOSIS — Z8719 Personal history of other diseases of the digestive system: Secondary | ICD-10-CM | POA: Insufficient documentation

## 2015-05-28 DIAGNOSIS — E119 Type 2 diabetes mellitus without complications: Secondary | ICD-10-CM | POA: Insufficient documentation

## 2015-05-28 DIAGNOSIS — Z794 Long term (current) use of insulin: Secondary | ICD-10-CM | POA: Insufficient documentation

## 2015-05-28 DIAGNOSIS — Z8619 Personal history of other infectious and parasitic diseases: Secondary | ICD-10-CM | POA: Insufficient documentation

## 2015-05-28 DIAGNOSIS — N390 Urinary tract infection, site not specified: Secondary | ICD-10-CM | POA: Diagnosis not present

## 2015-05-28 DIAGNOSIS — L299 Pruritus, unspecified: Secondary | ICD-10-CM | POA: Diagnosis not present

## 2015-05-28 DIAGNOSIS — Z87891 Personal history of nicotine dependence: Secondary | ICD-10-CM | POA: Diagnosis not present

## 2015-05-28 DIAGNOSIS — L282 Other prurigo: Secondary | ICD-10-CM

## 2015-05-28 DIAGNOSIS — R21 Rash and other nonspecific skin eruption: Secondary | ICD-10-CM | POA: Insufficient documentation

## 2015-05-28 DIAGNOSIS — R1030 Lower abdominal pain, unspecified: Secondary | ICD-10-CM | POA: Diagnosis present

## 2015-05-28 DIAGNOSIS — Z79899 Other long term (current) drug therapy: Secondary | ICD-10-CM | POA: Diagnosis not present

## 2015-05-28 LAB — CBC WITH DIFFERENTIAL/PLATELET
BASOS PCT: 1 %
Basophils Absolute: 0.1 10*3/uL (ref 0.0–0.1)
EOS PCT: 0 %
Eosinophils Absolute: 0 10*3/uL (ref 0.0–0.7)
HEMATOCRIT: 34.2 % — AB (ref 39.0–52.0)
HEMOGLOBIN: 11 g/dL — AB (ref 13.0–17.0)
Lymphocytes Relative: 26 %
Lymphs Abs: 2.4 10*3/uL (ref 0.7–4.0)
MCH: 27.9 pg (ref 26.0–34.0)
MCHC: 32.2 g/dL (ref 30.0–36.0)
MCV: 86.8 fL (ref 78.0–100.0)
MONO ABS: 1.2 10*3/uL — AB (ref 0.1–1.0)
Monocytes Relative: 13 %
NEUTROS ABS: 5.4 10*3/uL (ref 1.7–7.7)
NEUTROS PCT: 60 %
Platelets: 345 10*3/uL (ref 150–400)
RBC: 3.94 MIL/uL — ABNORMAL LOW (ref 4.22–5.81)
RDW: 16.1 % — ABNORMAL HIGH (ref 11.5–15.5)
WBC: 9.1 10*3/uL (ref 4.0–10.5)

## 2015-05-28 LAB — COMPREHENSIVE METABOLIC PANEL
ALBUMIN: 3.3 g/dL — AB (ref 3.5–5.0)
ALT: 60 U/L (ref 17–63)
AST: 72 U/L — AB (ref 15–41)
Alkaline Phosphatase: 398 U/L — ABNORMAL HIGH (ref 38–126)
Anion gap: 8 (ref 5–15)
BUN: 26 mg/dL — AB (ref 6–20)
CHLORIDE: 103 mmol/L (ref 101–111)
CO2: 24 mmol/L (ref 22–32)
CREATININE: 1.49 mg/dL — AB (ref 0.61–1.24)
Calcium: 9.5 mg/dL (ref 8.9–10.3)
GFR calc non Af Amer: 48 mL/min — ABNORMAL LOW (ref >60)
GFR, EST AFRICAN AMERICAN: 55 mL/min — AB (ref >60)
GLUCOSE: 141 mg/dL — AB (ref 65–99)
Potassium: 3.9 mmol/L (ref 3.5–5.1)
SODIUM: 135 mmol/L (ref 135–145)
Total Bilirubin: 1.3 mg/dL — ABNORMAL HIGH (ref 0.3–1.2)
Total Protein: 9.1 g/dL — ABNORMAL HIGH (ref 6.5–8.1)

## 2015-05-28 LAB — URINE MICROSCOPIC-ADD ON

## 2015-05-28 LAB — URINALYSIS, ROUTINE W REFLEX MICROSCOPIC
Bilirubin Urine: NEGATIVE
Glucose, UA: NEGATIVE mg/dL
Ketones, ur: NEGATIVE mg/dL
NITRITE: POSITIVE — AB
PROTEIN: 30 mg/dL — AB
SPECIFIC GRAVITY, URINE: 1.015 (ref 1.005–1.030)
pH: 6 (ref 5.0–8.0)

## 2015-05-28 MED ORDER — CEPHALEXIN 250 MG PO CAPS
500.0000 mg | ORAL_CAPSULE | Freq: Once | ORAL | Status: AC
  Administered 2015-05-28: 500 mg via ORAL
  Filled 2015-05-28: qty 2

## 2015-05-28 MED ORDER — CEPHALEXIN 500 MG PO CAPS: 500.0000 mg | ORAL_CAPSULE | Freq: Two times a day (BID) | ORAL | Status: DC

## 2015-05-28 MED ORDER — HYDROXYZINE HCL 25 MG PO TABS: 25.0000 mg | ORAL_TABLET | Freq: Three times a day (TID) | ORAL | Status: DC | PRN

## 2015-05-28 MED FILL — CEPHALEXIN 500 MG CAPSULE: 500 | 14 days supply | Qty: 28 | Fill #0

## 2015-05-28 MED FILL — hydrOXYzine HCL 25 MG TABS: 25 | 4 days supply | Qty: 12 | Fill #0

## 2015-05-28 NOTE — ED Notes (Signed)
Pt directed to pharmacy to pick up medications. Verbalizes understanding to f/u with PCP and dermatology

## 2015-05-28 NOTE — ED Notes (Addendum)
Pt amb to room 7 with quick steady gait in nad. Pt reports that he has had an itchy rash x November. Has seen his pcp several times, "she tried a lot of different things, but told me yesterday that she thinks it scabies, but that it's not her specialty. I'm still itching and I can't take it anymore so I came here. I can feel things crawling all over me, even in my rectum and on my penis. I put some cotton in my rectum so they couldn't get up in there." pt states he was prescribed permethrin yesterday and a medrol dose pack, per his d/c papers. Pt states he was told to apply the cream 8-14 hours, and then wait 7 days and reapply. Pt states he still has the cream on and has not washed it off.

## 2015-05-28 NOTE — Discharge Instructions (Signed)
Pruritus Pruritus is an itching feeling. There are many different conditions and factors that can make your skin itchy. Dry skin is one of the most common causes of itching. Most cases of itching do not require medical attention. Itchy skin can turn into a rash.  HOME CARE INSTRUCTIONS  Watch your pruritus for any changes. Take these steps to help with your condition:  Skin Care  Moisturize your skin as needed. A moisturizer that contains petroleum jelly is best for keeping moisture in your skin.  Take or apply medicines only as directed by your health care provider. This may include:  Corticosteroid cream.  Anti-itch lotions.  Oral anti-histamines.  Apply cool compresses to the affected areas.  Try taking a bath with:  Epsom salts. Follow the instructions on the packaging. You can get these at your local pharmacy or grocery store.  Baking soda. Pour a small amount into the bath as directed by your health care provider.  Colloidal oatmeal. Follow the instructions on the packaging. You can get this at your local pharmacy or grocery store.  Try applying baking soda paste to your skin. Stir water into baking soda until it reaches a paste-like consistency.   Do not scratch your skin.  Avoid hot showers or baths, which can make itching worse. A cold shower may help with itching as long as you use a moisturizer after.  Avoid scented soaps, detergents, and perfumes. Use gentle soaps, detergents, perfumes, and other cosmetic products. General Instructions  Avoid wearing tight clothes.  Keep a journal to help track what causes your itch. Write down:  What you eat.  What cosmetic products you use.  What you drink.  What you wear. This includes jewelry.  Use a humidifier. This keeps the air moist, which helps to prevent dry skin. SEEK MEDICAL CARE IF:  The itching does not go away after several days.  You sweat at night.  You have weight loss.  You are unusually  thirsty.  You urinate more than normal.  You are more tired than normal.  You have abdominal pain.  Your skin tingles.  You feel weak.  Your skin or the whites of your eyes look yellow (jaundice).  Your skin feels numb.   This information is not intended to replace advice given to you by your health care provider. Make sure you discuss any questions you have with your health care provider.   Document Released: 12/29/2010 Document Revised: 09/02/2014 Document Reviewed: 04/14/2014 Elsevier Interactive Patient Education 2016 [employer]  Urinary Tract Infection A urinary tract infection (UTI) can occur any place along the urinary tract. The tract includes the kidneys, ureters, bladder, and urethra. A type of germ called bacteria often causes a UTI. UTIs are often helped with antibiotic medicine.  HOME CARE   If given, take antibiotics as told by your doctor. Finish them even if you start to feel better.  Drink enough fluids to keep your pee (urine) clear or pale yellow.  Avoid tea, drinks with caffeine, and bubbly (carbonated) drinks.  Pee often. Avoid holding your pee in for a long time.  Pee before and after having sex (intercourse).  Wipe from front to back after you poop (bowel movement) if you are a woman. Use each tissue only once. GET HELP RIGHT AWAY IF:   You have back pain.  You have lower belly (abdominal) pain.  You have chills.  You feel sick to your stomach (nauseous).  You throw up (vomit).  Your burning or  discomfort with peeing does not go away.  You have a fever.  Your symptoms are not better in 3 days. MAKE SURE YOU:   Understand these instructions.  Will watch your condition.  Will get help right away if you are not doing well or get worse.   This information is not intended to replace advice given to you by your health care provider. Make sure you discuss any questions you have with your health care provider.   Document Released:  10/05/2007 Document Revised: 05/09/2014 Document Reviewed: 11/17/2011 Elsevier Interactive Patient Education [employer]

## 2015-05-28 NOTE — ED Provider Notes (Signed)
CSN: [id number]     Arrival date & time 05/28/15  4098 History   First MD Initiated Contact with Patient 05/28/15 1048     Chief Complaint  Patient presents with  . Rash  . Abdominal Pain     Patient is a 65 y.o. male presenting with rash and abdominal pain. The history is provided by the patient. No language interpreter was used.  Rash Quality: scaling   Associated symptoms: abdominal pain   Abdominal Pain  Bobby Morse is a 65 y.o. male who presents to the Emergency Department complaining of abdominal pain and rash.  He reports a month and a half of lower abdominal pain described as discomfort with associated dysuria.  He also endorses a genrealized itchy rash that began in November.  He states that he feels as if enzymes or going throughout is body.  He saw his PCP yesterday and was started on permethrin and steroids.  He denies any fevers, chest pain, SOB, nausea, vomiting, diarrhea, constipation.  Sxs are moderate, waxing and waning.    Past Medical History  Diagnosis Date  . Diabetes mellitus without complication (HCC)   . Hepatitis C   . Cirrhosis of liver [facility])    Past Surgical History  Procedure Laterality Date  . Appendectomy    . Shoulder arthroscopy     History reviewed. No pertinent family history. Social History  Substance Use Topics  . Smoking status: Former Games developer  . Smokeless tobacco: None  . Alcohol Use: No    Review of Systems  Gastrointestinal: Positive for abdominal pain.  Skin: Positive for rash.  All other systems reviewed and are negative.     Allergies  Review of patient's allergies indicates no known allergies.  Home Medications   Prior to Admission medications   Medication Sig Start Date End Date Taking? Authorizing Provider  cephALEXin (KEFLEX) 500 MG capsule Take 1 capsule (500 mg total) by mouth 2 (two) times daily. 05/28/15   Tilden Fossa, MD  hydrOXYzine (ATARAX/VISTARIL) 25 MG tablet Take 1 tablet (25 mg total) by mouth every 8  (eight) hours as needed for itching. 05/28/15   Tilden Fossa, MD  hydrOXYzine (VISTARIL) 25 MG capsule Take 25 mg by mouth 3 (three) times daily. 01/21/15   Historical Provider, MD  LANTUS SOLOSTAR 100 UNIT/ML Solostar Pen Inject 60-80 Units into the skin 2 (two) times daily. 80 units in the morning, 60 units in the evening 01/01/15   Historical Provider, MD  lisinopril-hydrochlorothiazide (PRINZIDE,ZESTORETIC) 20-25 MG per tablet Take 1 tablet by mouth daily. 01/01/15   Historical Provider, MD  LYRICA 100 MG capsule Take 100 mg by mouth 3 (three) times daily. 01/15/15   Historical Provider, MD  omeprazole (PRILOSEC) 40 MG capsule Take 40 mg by mouth daily. 01/14/15   Historical Provider, MD  oxyCODONE (ROXICODONE) 5 MG immediate release tablet Take 1 tablet (5 mg total) by mouth every 6 (six) hours as needed for severe pain. 05/18/15   Trixie Dredge, PA-C  traMADol (ULTRAM) 50 MG tablet Take 1 tablet (50 mg total) by mouth every 6 (six) hours as needed. 01/26/15   Gilda Crease, MD   BP 163/91 mmHg  Pulse 74  Temp(Src) 98.1 F (36.7 C) (Oral)  Resp 18  Ht  (1.778 m)  Wt 188 lb (85.276 kg)  BMI 26.98 kg/m2  SpO2 99% Physical Exam  Constitutional: He is oriented to person, place, and time. He appears well-developed and well-nourished.  HENT:  Head: Normocephalic and atraumatic.  Cardiovascular: Normal rate and regular rhythm.   Pulmonary/Chest: Effort normal. No respiratory distress.  Abdominal: Soft. There is no tenderness. There is no rebound and no guarding.  Musculoskeletal: He exhibits no edema or tenderness.  Neurological: He is alert and oriented to person, place, and time.  Skin: Skin is warm and dry.  There are multiple small nodules on BLE (distal leg and foot) as well as BUE.  There is a central indentation to these nodules with occasional slight ulceration.  No erythema or cellulitis.    Psychiatric: He has a normal mood and affect. His behavior is normal.  Nursing note  and vitals reviewed.   ED Course  Procedures (including critical care time) Labs Review Labs Reviewed  URINALYSIS, ROUTINE W REFLEX MICROSCOPIC (NOT AT [facility]) - Abnormal; Notable for the following:    APPearance CLOUDY (*)    Hgb urine dipstick TRACE (*)    Protein, ur 30 (*)    Nitrite POSITIVE (*)    Leukocytes, UA MODERATE (*)    All other components within normal limits  COMPREHENSIVE METABOLIC PANEL - Abnormal; Notable for the following:    Glucose, Bld 141 (*)    BUN 26 (*)    Creatinine, Ser 1.49 (*)    Total Protein 9.1 (*)    Albumin 3.3 (*)    AST 72 (*)    Alkaline Phosphatase 398 (*)    Total Bilirubin 1.3 (*)    GFR calc non Af Amer 48 (*)    GFR calc Af Amer 55 (*)    All other components within normal limits  CBC WITH DIFFERENTIAL/PLATELET - Abnormal; Notable for the following:    RBC 3.94 (*)    Hemoglobin 11.0 (*)    HCT 34.2 (*)    RDW 16.1 (*)    Monocytes Absolute 1.2 (*)    All other components within normal limits  URINE MICROSCOPIC-ADD ON - Abnormal; Notable for the following:    Squamous Epithelial / LPF 0-5 (*)    Bacteria, UA MANY (*)    All other components within normal limits  URINE CULTURE    Imaging Review No results found. I have personally reviewed and evaluated these images and lab results as part of my medical decision-making.   EKG Interpretation None      MDM   Final diagnoses:  Acute UTI  Pruritic rash    Patient here for evaluation of dysuria, abdominal pain, rash. In terms of dysuria and abdominal pain, UA is consistent with UTI. Presentation is not consistent with obstructing kidney stone, bowel obstruction. Patient recently had CT abdomen performed in the last 2 weeks.Discussed with patient treatment for UTI with antibiotics, close outpatient follow-up, return precautions. BMP demonstrates stable renal insufficiency compared to priors. Patient has considerable elevation in his alkaline phosphatase that is unchanged from  prior comparisons. His CT scan did demonstrate a gallstone, he has no upper abdominal tenderness, presentation is not consistent with cholecystitis. In terms of rash, there is no evidence of cellulitis or abscess. Discussed discontinuing his steroids in the setting of current urinary tract infection. Discussed the importance of dermatology follow-up for further evaluation of his rash.    Tilden Fossa, MD 05/28/15 [phone]

## 2015-05-30 LAB — URINE CULTURE

## 2015-06-01 ENCOUNTER — Telehealth (HOSPITAL_COMMUNITY): Payer: Self-pay

## 2015-06-01 NOTE — Telephone Encounter (Signed)
Post ED Visit - Positive Culture Follow-up  Culture report reviewed by antimicrobial stewardship pharmacist:   Enzo Bi, Pharm.D.  Celedonio Miyamoto, Pharm.D., BCPS  Garvin Fila, Pharm.D.  Georgina Pillion, Pharm.D., BCPS  [place], [address].D., BCPS, AAHIVP  Estella Husk, Pharm.D., BCPS, AAHIVP  Tennis Must, [address].D.  Sherle Poe, [address].D.  Positive urine culture, >/= 100,000 colonies -> E Coli Treated with Cephalexin, organism sensitive to the same and no further patient follow-up is required at this time.  Arvid Right 06/01/2015, 3:23 AM

## 2015-06-11 ENCOUNTER — Emergency Department (HOSPITAL_BASED_OUTPATIENT_CLINIC_OR_DEPARTMENT_OTHER)
Admission: EM | Admit: 2015-06-11 | Discharge: 2015-06-11 | Disposition: A | Discharge status: Home / Self Care | Payer: Medicare Other | Attending: Emergency Medicine | Admitting: Emergency Medicine

## 2015-06-11 ENCOUNTER — Encounter (HOSPITAL_BASED_OUTPATIENT_CLINIC_OR_DEPARTMENT_OTHER): Payer: Self-pay

## 2015-06-11 DIAGNOSIS — Z8719 Personal history of other diseases of the digestive system: Secondary | ICD-10-CM | POA: Insufficient documentation

## 2015-06-11 DIAGNOSIS — Z8619 Personal history of other infectious and parasitic diseases: Secondary | ICD-10-CM | POA: Insufficient documentation

## 2015-06-11 DIAGNOSIS — L299 Pruritus, unspecified: Secondary | ICD-10-CM | POA: Diagnosis not present

## 2015-06-11 DIAGNOSIS — Z794 Long term (current) use of insulin: Secondary | ICD-10-CM | POA: Diagnosis not present

## 2015-06-11 DIAGNOSIS — E119 Type 2 diabetes mellitus without complications: Secondary | ICD-10-CM | POA: Diagnosis not present

## 2015-06-11 DIAGNOSIS — R21 Rash and other nonspecific skin eruption: Secondary | ICD-10-CM | POA: Insufficient documentation

## 2015-06-11 DIAGNOSIS — Z79899 Other long term (current) drug therapy: Secondary | ICD-10-CM | POA: Insufficient documentation

## 2015-06-11 DIAGNOSIS — I10 Essential (primary) hypertension: Secondary | ICD-10-CM | POA: Insufficient documentation

## 2015-06-11 DIAGNOSIS — Z87891 Personal history of nicotine dependence: Secondary | ICD-10-CM | POA: Diagnosis not present

## 2015-06-11 DIAGNOSIS — Z792 Long term (current) use of antibiotics: Secondary | ICD-10-CM | POA: Insufficient documentation

## 2015-06-11 DIAGNOSIS — L282 Other prurigo: Secondary | ICD-10-CM

## 2015-06-11 HISTORY — DX: Essential (primary) hypertension: I10

## 2015-06-11 MED ORDER — DOXEPIN HCL 50 MG PO CAPS: ORAL_CAPSULE | ORAL | Status: DC

## 2015-06-11 NOTE — ED Provider Notes (Signed)
CSN: [id number]     Arrival date & time 06/11/15  0202 History   First MD Initiated Contact with Patient 06/11/15 0248     Chief Complaint  Patient presents with  . Rash     (Consider location/radiation/quality/duration/timing/severity/associated sxs/prior Treatment) HPI  This is a 65 year old male with a history of cirrhosis due to hepatitis C. He has had a pruritic maculopapular rash for over a month. His primary care physician attributes this to his cirrhosis. The itching has been severe and he is not getting adequate relief even taking 100 milligrams of hydroxyzine. He has an appointment pending with a dermatologist on the 14th of this month but is requesting relief in the meantime. He is also having chronic upper abdominal pain also associated with his cirrhosis which he states is "tolerable".  Past Medical History  Diagnosis Date  . Diabetes mellitus without complication (HCC)   . Hepatitis C   . Cirrhosis of liver (HCC)   . Hypertension    Past Surgical History  Procedure Laterality Date  . Appendectomy    . Shoulder arthroscopy     No family history on file. Social History  Substance Use Topics  . Smoking status: Former Games developer  . Smokeless tobacco: None  . Alcohol Use: No    Review of Systems  All other systems reviewed and are negative.   Allergies  Benadryl  Home Medications   Prior to Admission medications   Medication Sig Start Date End Date Taking? Authorizing Provider  cephALEXin (KEFLEX) 500 MG capsule Take 1 capsule (500 mg total) by mouth 2 (two) times daily. 05/28/15   Tilden Fossa, MD  hydrOXYzine (ATARAX/VISTARIL) 25 MG tablet Take 1 tablet (25 mg total) by mouth every 8 (eight) hours as needed for itching. 05/28/15   Tilden Fossa, MD  hydrOXYzine (VISTARIL) 25 MG capsule Take 25 mg by mouth 3 (three) times daily. 01/21/15   Historical Provider, MD  LANTUS SOLOSTAR 100 UNIT/ML Solostar Pen Inject 60-80 Units into the skin 2 (two) times daily. 80  units in the morning, 60 units in the evening 01/01/15   Historical Provider, MD  lisinopril-hydrochlorothiazide (PRINZIDE,ZESTORETIC) 20-25 MG per tablet Take 1 tablet by mouth daily. 01/01/15   Historical Provider, MD  LYRICA 100 MG capsule Take 100 mg by mouth 3 (three) times daily. 01/15/15   Historical Provider, MD  omeprazole (PRILOSEC) 40 MG capsule Take 40 mg by mouth daily. 01/14/15   Historical Provider, MD  oxyCODONE (ROXICODONE) 5 MG immediate release tablet Take 1 tablet (5 mg total) by mouth every 6 (six) hours as needed for severe pain. 05/18/15   Trixie Dredge, PA-C  traMADol (ULTRAM) 50 MG tablet Take 1 tablet (50 mg total) by mouth every 6 (six) hours as needed. 01/26/15   Gilda Crease, MD   BP 161/96 mmHg  Pulse 90  Temp(Src) 98.7 F (37.1 C) (Oral)  Resp 20  Ht  (1.778 m)  Wt 197 lb (89.359 kg)  BMI 28.27 kg/m2  SpO2 98%   Physical Exam  General: Well-developed, well-nourished male in no acute distress; appearance consistent with age of record HENT: normocephalic; atraumatic Eyes: pupils equal, round and reactive to light; extraocular muscles intact; arcus senilis bilaterally Neck: supple Heart: regular rate and rhythm Lungs: clear to auscultation bilaterally Abdomen: soft; nondistended; right upper quadrant and left upper quadrant tenderness; hepatomegaly; bowel sounds present Extremities: No deformity; full range of motion; pulses normal Neurologic: Awake, alert and oriented; motor function intact in all extremities and  symmetric; no facial droop Skin: Warm and dry; scattered hyperpigmented maculopapular rash:   Psychiatric: Normal mood and affect    ED Course  Procedures (including critical care time)   MDM  We will stop the hydoxyzine and try doxepin  at bedtime.     Paula Libra, MD 06/11/15 [phone]

## 2015-06-11 NOTE — Discharge Instructions (Signed)
Pruritus °Pruritus is an itching feeling. There are many different conditions and factors that can make your skin itchy. Dry skin is one of the most common causes of itching. Most cases of itching do not require medical attention. Itchy skin can turn into a rash.  °HOME CARE INSTRUCTIONS  °Watch your pruritus for any changes. Take these steps to help with your condition:  °Skin Care °· Moisturize your skin as needed. A moisturizer that contains petroleum jelly is best for keeping moisture in your skin. °· Take or apply medicines only as directed by your health care provider. This may include: °¨ Corticosteroid cream. °¨ Anti-itch lotions. °¨ Oral anti-histamines. °· Apply cool compresses to the affected areas. °· Try taking a bath with: °¨ Epsom salts. Follow the instructions on the packaging. You can get these at your local pharmacy or grocery store. °¨ Baking soda. Pour a small amount into the bath as directed by your health care provider. °¨ Colloidal oatmeal. Follow the instructions on the packaging. You can get this at your local pharmacy or grocery store. °· Try applying baking soda paste to your skin. Stir water into baking soda until it reaches a paste-like consistency.   °· Do not scratch your skin. °· Avoid hot showers or baths, which can make itching worse. A cold shower may help with itching as long as you use a moisturizer after. °· Avoid scented soaps, detergents, and perfumes. Use gentle soaps, detergents, perfumes, and other cosmetic products. °General Instructions °· Avoid wearing tight clothes. °· Keep a journal to help track what causes your itch. Write down: °¨ What you eat. °¨ What cosmetic products you use. °¨ What you drink. °¨ What you wear. This includes jewelry. °· Use a humidifier. This keeps the air moist, which helps to prevent dry skin. °SEEK MEDICAL CARE IF: °· The itching does not go away after several days. °· You sweat at night. °· You have weight loss. °· You are unusually  thirsty. °· You urinate more than normal. °· You are more tired than normal. °· You have abdominal pain. °· Your skin tingles. °· You feel weak. °· Your skin or the whites of your eyes look yellow (jaundice). °· Your skin feels numb. °  °This information is not intended to replace advice given to you by your health care provider. Make sure you discuss any questions you have with your health care provider. °  °Document Released: 12/29/2010 Document Revised: 09/02/2014 Document Reviewed: 04/14/2014 °Elsevier Interactive Patient Education ©2016 [employer] ° °

## 2015-06-11 NOTE — ED Notes (Signed)
Pt c/o rash with itching all over x36month, bilateral side pain x55month; states this is from his liver damage and kidney failure, states can't take it anymore

## 2015-06-11 NOTE — ED Notes (Signed)
MD at bedside. 

## 2015-06-30 ENCOUNTER — Encounter (HOSPITAL_BASED_OUTPATIENT_CLINIC_OR_DEPARTMENT_OTHER): Payer: Self-pay

## 2015-06-30 DIAGNOSIS — K769 Liver disease, unspecified: Secondary | ICD-10-CM | POA: Insufficient documentation

## 2015-06-30 DIAGNOSIS — I129 Hypertensive chronic kidney disease with stage 1 through stage 4 chronic kidney disease, or unspecified chronic kidney disease: Secondary | ICD-10-CM | POA: Diagnosis not present

## 2015-06-30 DIAGNOSIS — Z794 Long term (current) use of insulin: Secondary | ICD-10-CM | POA: Insufficient documentation

## 2015-06-30 DIAGNOSIS — R7989 Other specified abnormal findings of blood chemistry: Secondary | ICD-10-CM | POA: Diagnosis not present

## 2015-06-30 DIAGNOSIS — K219 Gastro-esophageal reflux disease without esophagitis: Secondary | ICD-10-CM | POA: Diagnosis not present

## 2015-06-30 DIAGNOSIS — L299 Pruritus, unspecified: Secondary | ICD-10-CM | POA: Insufficient documentation

## 2015-06-30 DIAGNOSIS — K7460 Unspecified cirrhosis of liver: Secondary | ICD-10-CM | POA: Insufficient documentation

## 2015-06-30 DIAGNOSIS — Z803 Family history of malignant neoplasm of breast: Secondary | ICD-10-CM | POA: Insufficient documentation

## 2015-06-30 DIAGNOSIS — Z87891 Personal history of nicotine dependence: Secondary | ICD-10-CM | POA: Diagnosis not present

## 2015-06-30 DIAGNOSIS — R740 Nonspecific elevation of levels of transaminase and lactic acid dehydrogenase [LDH]: Secondary | ICD-10-CM | POA: Diagnosis not present

## 2015-06-30 DIAGNOSIS — B1920 Unspecified viral hepatitis C without hepatic coma: Secondary | ICD-10-CM | POA: Diagnosis not present

## 2015-06-30 DIAGNOSIS — Z888 Allergy status to other drugs, medicaments and biological substances status: Secondary | ICD-10-CM | POA: Insufficient documentation

## 2015-06-30 DIAGNOSIS — Z800 Family history of malignant neoplasm of digestive organs: Secondary | ICD-10-CM | POA: Insufficient documentation

## 2015-06-30 DIAGNOSIS — J45909 Unspecified asthma, uncomplicated: Secondary | ICD-10-CM | POA: Diagnosis not present

## 2015-06-30 DIAGNOSIS — R1011 Right upper quadrant pain: Principal | ICD-10-CM | POA: Insufficient documentation

## 2015-06-30 DIAGNOSIS — R112 Nausea with vomiting, unspecified: Secondary | ICD-10-CM | POA: Insufficient documentation

## 2015-06-30 DIAGNOSIS — E1122 Type 2 diabetes mellitus with diabetic chronic kidney disease: Secondary | ICD-10-CM | POA: Insufficient documentation

## 2015-06-30 DIAGNOSIS — R21 Rash and other nonspecific skin eruption: Secondary | ICD-10-CM | POA: Insufficient documentation

## 2015-06-30 DIAGNOSIS — I708 Atherosclerosis of other arteries: Secondary | ICD-10-CM | POA: Insufficient documentation

## 2015-06-30 DIAGNOSIS — N179 Acute kidney failure, unspecified: Secondary | ICD-10-CM | POA: Insufficient documentation

## 2015-06-30 DIAGNOSIS — N183 Chronic kidney disease, stage 3 (moderate): Secondary | ICD-10-CM | POA: Diagnosis not present

## 2015-06-30 DIAGNOSIS — K8020 Calculus of gallbladder without cholecystitis without obstruction: Secondary | ICD-10-CM | POA: Diagnosis not present

## 2015-06-30 DIAGNOSIS — E1165 Type 2 diabetes mellitus with hyperglycemia: Secondary | ICD-10-CM | POA: Diagnosis not present

## 2015-06-30 DIAGNOSIS — K5900 Constipation, unspecified: Secondary | ICD-10-CM | POA: Insufficient documentation

## 2015-06-30 NOTE — ED Notes (Addendum)
Pt c/o chronic bilateral abdominal pain with 3 episodes of vomiting today.  He has been seen for the same in the past but states he has never had vomiting with the pain before

## 2015-07-01 ENCOUNTER — Encounter (HOSPITAL_COMMUNITY): Payer: Self-pay | Admitting: Nurse Practitioner

## 2015-07-01 ENCOUNTER — Observation Stay (HOSPITAL_BASED_OUTPATIENT_CLINIC_OR_DEPARTMENT_OTHER)
Admission: EM | Admit: 2015-07-01 | Discharge: 2015-07-02 | Disposition: A | Discharge status: Home / Self Care | Payer: Medicare Other | Attending: Internal Medicine | Admitting: Internal Medicine

## 2015-07-01 ENCOUNTER — Observation Stay (HOSPITAL_COMMUNITY): Payer: Medicare Other

## 2015-07-01 DIAGNOSIS — K8020 Calculus of gallbladder without cholecystitis without obstruction: Secondary | ICD-10-CM

## 2015-07-01 DIAGNOSIS — N183 Chronic kidney disease, stage 3 unspecified: Secondary | ICD-10-CM

## 2015-07-01 DIAGNOSIS — R1011 Right upper quadrant pain: Secondary | ICD-10-CM | POA: Diagnosis not present

## 2015-07-01 DIAGNOSIS — R109 Unspecified abdominal pain: Secondary | ICD-10-CM

## 2015-07-01 DIAGNOSIS — R7989 Other specified abnormal findings of blood chemistry: Secondary | ICD-10-CM

## 2015-07-01 DIAGNOSIS — L299 Pruritus, unspecified: Secondary | ICD-10-CM

## 2015-07-01 DIAGNOSIS — E1110 Type 2 diabetes mellitus with ketoacidosis without coma: Secondary | ICD-10-CM

## 2015-07-01 DIAGNOSIS — K5900 Constipation, unspecified: Secondary | ICD-10-CM

## 2015-07-01 DIAGNOSIS — R1110 Vomiting, unspecified: Secondary | ICD-10-CM | POA: Insufficient documentation

## 2015-07-01 DIAGNOSIS — L282 Other prurigo: Secondary | ICD-10-CM

## 2015-07-01 DIAGNOSIS — K7469 Other cirrhosis of liver: Secondary | ICD-10-CM

## 2015-07-01 DIAGNOSIS — K7460 Unspecified cirrhosis of liver: Secondary | ICD-10-CM | POA: Insufficient documentation

## 2015-07-01 DIAGNOSIS — R112 Nausea with vomiting, unspecified: Secondary | ICD-10-CM

## 2015-07-01 DIAGNOSIS — R7401 Elevation of levels of liver transaminase levels: Secondary | ICD-10-CM | POA: Diagnosis present

## 2015-07-01 DIAGNOSIS — R945 Abnormal results of liver function studies: Secondary | ICD-10-CM

## 2015-07-01 DIAGNOSIS — R740 Nonspecific elevation of levels of transaminase and lactic acid dehydrogenase [LDH]: Secondary | ICD-10-CM | POA: Diagnosis not present

## 2015-07-01 DIAGNOSIS — N189 Chronic kidney disease, unspecified: Secondary | ICD-10-CM

## 2015-07-01 DIAGNOSIS — N179 Acute kidney failure, unspecified: Secondary | ICD-10-CM

## 2015-07-01 LAB — URINALYSIS, ROUTINE W REFLEX MICROSCOPIC
Glucose, UA: NEGATIVE mg/dL
HGB URINE DIPSTICK: NEGATIVE
KETONES UR: NEGATIVE mg/dL
NITRITE: NEGATIVE
Protein, ur: NEGATIVE mg/dL
SPECIFIC GRAVITY, URINE: 1.018 (ref 1.005–1.030)
pH: 5.5 (ref 5.0–8.0)

## 2015-07-01 LAB — CBC WITH DIFFERENTIAL/PLATELET
BASOS ABS: 0 10*3/uL (ref 0.0–0.1)
BASOS PCT: 0 %
EOS PCT: 3 %
Eosinophils Absolute: 0.4 10*3/uL (ref 0.0–0.7)
HCT: 34.7 % — ABNORMAL LOW (ref 39.0–52.0)
Hemoglobin: 12 g/dL — ABNORMAL LOW (ref 13.0–17.0)
LYMPHS PCT: 23 %
Lymphs Abs: 2.7 10*3/uL (ref 0.7–4.0)
MCH: 28.9 pg (ref 26.0–34.0)
MCHC: 34.6 g/dL (ref 30.0–36.0)
MCV: 83.6 fL (ref 78.0–100.0)
MONO ABS: 1.4 10*3/uL — AB (ref 0.1–1.0)
Monocytes Relative: 12 %
Neutro Abs: 6.9 10*3/uL (ref 1.7–7.7)
Neutrophils Relative %: 61 %
PLATELETS: 367 10*3/uL (ref 150–400)
RBC: 4.15 MIL/uL — AB (ref 4.22–5.81)
RDW: 17 % — AB (ref 11.5–15.5)
WBC: 11.3 10*3/uL — AB (ref 4.0–10.5)

## 2015-07-01 LAB — COMPREHENSIVE METABOLIC PANEL
ALT: 176 U/L — AB (ref 17–63)
ANION GAP: 9 (ref 5–15)
AST: 220 U/L — ABNORMAL HIGH (ref 15–41)
Albumin: 3.1 g/dL — ABNORMAL LOW (ref 3.5–5.0)
Alkaline Phosphatase: 525 U/L — ABNORMAL HIGH (ref 38–126)
BUN: 33 mg/dL — ABNORMAL HIGH (ref 6–20)
CHLORIDE: 103 mmol/L (ref 101–111)
CO2: 22 mmol/L (ref 22–32)
CREATININE: 1.64 mg/dL — AB (ref 0.61–1.24)
Calcium: 9 mg/dL (ref 8.9–10.3)
GFR, EST AFRICAN AMERICAN: 49 mL/min — AB (ref >60)
GFR, EST NON AFRICAN AMERICAN: 42 mL/min — AB (ref >60)
Glucose, Bld: 172 mg/dL — ABNORMAL HIGH (ref 65–99)
POTASSIUM: 3.9 mmol/L (ref 3.5–5.1)
SODIUM: 134 mmol/L — AB (ref 135–145)
Total Bilirubin: 3.5 mg/dL — ABNORMAL HIGH (ref 0.3–1.2)
Total Protein: 8.8 g/dL — ABNORMAL HIGH (ref 6.5–8.1)

## 2015-07-01 LAB — URINE MICROSCOPIC-ADD ON

## 2015-07-01 LAB — LIPASE, BLOOD: LIPASE: 30 U/L (ref 11–51)

## 2015-07-01 LAB — GLUCOSE, CAPILLARY
GLUCOSE-CAPILLARY: 170 mg/dL — AB (ref 65–99)
GLUCOSE-CAPILLARY: 170 mg/dL — AB (ref 65–99)
Glucose-Capillary: 153 mg/dL — ABNORMAL HIGH (ref 65–99)

## 2015-07-01 LAB — PROTIME-INR
INR: 0.94 (ref 0.00–1.49)
PROTHROMBIN TIME: 12.8 s (ref 11.6–15.2)

## 2015-07-01 LAB — APTT: APTT: 30 s (ref 24–37)

## 2015-07-01 MED ORDER — HYDROXYZINE HCL 25 MG PO TABS
25.0000 mg | ORAL_TABLET | Freq: Four times a day (QID) | ORAL | Status: DC | PRN
Start: 2015-07-01 — End: 2015-07-02
  Administered 2015-07-01: 25 mg via ORAL
  Filled 2015-07-01: qty 1

## 2015-07-01 MED ORDER — ENOXAPARIN SODIUM 40 MG/0.4ML ~~LOC~~ SOLN
40.0000 mg | SUBCUTANEOUS | Status: DC
  Administered 2015-07-01 – 2015-07-02 (×2): 40 mg via SUBCUTANEOUS
  Filled 2015-07-01 (×2): qty 0.4

## 2015-07-01 MED ORDER — SODIUM CHLORIDE 0.9 % IV SOLN
INTRAVENOUS | Status: DC
  Administered 2015-07-01 (×2): via INTRAVENOUS

## 2015-07-01 MED ORDER — PANTOPRAZOLE SODIUM 40 MG PO TBEC
40.0000 mg | DELAYED_RELEASE_TABLET | Freq: Every day | ORAL | Status: DC
  Administered 2015-07-01 – 2015-07-02 (×2): 40 mg via ORAL
  Filled 2015-07-01 (×2): qty 1

## 2015-07-01 MED ORDER — PERMETHRIN 5 % EX CREA
TOPICAL_CREAM | Freq: Once | CUTANEOUS | Status: AC
  Administered 2015-07-01: 21:00:00 via TOPICAL
  Filled 2015-07-01: qty 60

## 2015-07-01 MED ORDER — INSULIN ASPART 100 UNIT/ML ~~LOC~~ SOLN
0.0000 [IU] | Freq: Three times a day (TID) | SUBCUTANEOUS | Status: DC
  Administered 2015-07-01 – 2015-07-02 (×2): 2 [IU] via SUBCUTANEOUS
  Administered 2015-07-02: 1 [IU] via SUBCUTANEOUS

## 2015-07-01 MED ORDER — ONDANSETRON HCL 4 MG/2ML IJ SOLN
4.0000 mg | Freq: Once | INTRAMUSCULAR | Status: AC
  Administered 2015-07-01: 4 mg via INTRAVENOUS
  Filled 2015-07-01: qty 2

## 2015-07-01 MED ORDER — LORAZEPAM 2 MG/ML IJ SOLN
1.0000 mg | Freq: Once | INTRAMUSCULAR | Status: AC
  Administered 2015-07-01: 1 mg via INTRAVENOUS
  Filled 2015-07-01: qty 1

## 2015-07-01 MED ORDER — PREGABALIN 100 MG PO CAPS
100.0000 mg | ORAL_CAPSULE | Freq: Three times a day (TID) | ORAL | Status: DC
  Administered 2015-07-01 – 2015-07-02 (×5): 100 mg via ORAL
  Filled 2015-07-01 (×5): qty 1

## 2015-07-01 MED ORDER — HYDRALAZINE HCL 25 MG PO TABS: 25.0000 mg | ORAL_TABLET | Freq: Three times a day (TID) | ORAL | Status: DC | PRN

## 2015-07-01 MED ORDER — ONDANSETRON HCL 4 MG/2ML IJ SOLN: 4.0000 mg | Freq: Four times a day (QID) | INTRAMUSCULAR | Status: DC | PRN

## 2015-07-01 MED ORDER — ONDANSETRON HCL 4 MG PO TABS: 4.0000 mg | ORAL_TABLET | Freq: Four times a day (QID) | ORAL | Status: DC | PRN

## 2015-07-01 MED ORDER — DOXEPIN HCL 50 MG PO CAPS
50.0000 mg | ORAL_CAPSULE | Freq: Every day | ORAL | Status: DC
  Administered 2015-07-01: 50 mg via ORAL
  Filled 2015-07-01 (×2): qty 1

## 2015-07-01 MED ORDER — SODIUM CHLORIDE 0.9 % IV SOLN: INTRAVENOUS | Status: AC

## 2015-07-01 NOTE — Care Management Obs Status (Signed)
MEDICARE OBSERVATION STATUS NOTIFICATION   Patient Details  Name: Bobby Morse MRN: [id number] Date of Birth: Sep 06, 1950   Medicare Observation Status Notification Given:  Yes    Durenda Guthrie, RN 07/01/2015, 11:51 AM

## 2015-07-01 NOTE — H&P (Signed)
[facility] History and Physical  Bobby Morse EAV:[id number] DOB: [birth year] DOA: 07/01/2015  Referring physician: Emergency Department PCP: Bobby Caroli, MD   CHIEF COMPLAINT:                   HPI: Bobby Morse is a 65 y.o. male with HCV cirrhosis, history of alcohol abuse, and asthma. Patient is followed at ;[facility]. He did not complete HCV treatment with [facility].  Patient has known liver lesions followed at [facility]. The largest lesion felt to be benign, a smaller one possibly HCC. AFP in Sept was in the 400s. Patient states that [facility] plans a follow-up MRI and biopsy of the liver lesions but this has not been yet scheduled. Patient states he has an appointment with his PCP, Bobby Morse, at [facility] tomorrow.   Patient presented to the emergency department last night with 3 episodes of nonbloody vomiting and diffuse abdominal pain. He complains of pruritus over the last several months. Patient does have scattered sores for which PCP has been treating him for scabies. Patient keeps falling asleep in between questions . He gives a vague description of his abdominal pain but it seems to be diffuse and unrelated to meals. Bowel movements are regular. The pain is intermittent and he endorses some radiation to the back. He has never had this type of abdominal pain. Appetite has been okay, weight stable. No fevers.  ED COURSE:           Labs:   BUN 33, creatinine 1.64 Glucose 172, PHOSPHATASE 525, lipase 30, AST 220, ALT 176, total bilirubin 3.5 Wbc 11 point 3, hemoglobin 12,  platelets 367, INR 0.94  Urinalysis:    Unremarkable               Medications  0.9 %  sodium chloride infusion (not administered)  LORazepam (ATIVAN) injection 1 mg (1 mg Intravenous Given 07/01/15 0132)  ondansetron (ZOFRAN) injection 4 mg (4 mg Intravenous Given 07/01/15 0132)    Review of Systems  Constitutional: Negative.   HENT: Negative.   Eyes: Negative.   Respiratory: Negative.     Cardiovascular: Negative.   Gastrointestinal: Positive for nausea, vomiting and abdominal pain.  Genitourinary: Negative.   Musculoskeletal: Negative.   Skin: Positive for itching and rash.  Neurological: Negative.   Endo/Heme/Allergies: Negative.   Psychiatric/Behavioral: Negative.     Past Medical History  Diagnosis Date  . Diabetes mellitus without complication (HCC)   . Hepatitis C   . Cirrhosis of liver (HCC)   . Hypertension    Past Surgical History  Procedure Laterality Date  . Appendectomy    . Shoulder arthroscopy      SOCIAL HISTORY:  reports that he has quit smoking. He does not have any smokeless tobacco history on file. He reports that he does not drink alcohol or use illicit drugs. Lives: at home alone   Assistive devices:   None needed for ambulation.   Allergies  Allergen Reactions  . Benadryl [Diphenhydramine Hcl]     Constipation    FMH Hypertension - maternal grandfather Colon cancer maternal grandfather Breast cancer - mother  Prior to Admission medications   Medication Sig Start Date End Date Taking? Authorizing Provider  cephALEXin (KEFLEX) 500 MG capsule Take 1 capsule (500 mg total) by mouth 2 (two) times daily. 05/28/15   Bobby Fossa, MD  doxepin (SINEQUAN) 50 MG capsule Take 1 tablet at bedtime daily as needed for itching. May cause drowsiness, dry  mouth or urinary retention. 06/11/15   Bobby Molpus, MD  LANTUS SOLOSTAR 100 UNIT/ML Solostar Pen Inject 60-80 Units into the skin 2 (two) times daily. 80 units in the morning, 60 units in the evening 01/01/15   Historical Provider, MD  lisinopril-hydrochlorothiazide (PRINZIDE,ZESTORETIC) 20-25 MG per tablet Take 1 tablet by mouth daily. 01/01/15   Historical Provider, MD  LYRICA 100 MG capsule Take 100 mg by mouth 3 (three) times daily. 01/15/15   Historical Provider, MD  omeprazole (PRILOSEC) 40 MG capsule Take 40 mg by mouth daily. 01/14/15   Historical Provider, MD  oxyCODONE (ROXICODONE) 5 MG immediate  release tablet Take 1 tablet (5 mg total) by mouth every 6 (six) hours as needed for severe pain. 05/18/15   Bobby Dredge, PA-C   PHYSICAL EXAM: Filed Vitals:   06/30/15 2239 07/01/15 0049  BP: 144/98 147/88  Pulse: 87 91  Temp: 98.1 F (36.7 C)   TempSrc: Oral   Resp: 18 18  Height:  (1.778 m)   Weight: 86.183 kg (190 lb)   SpO2: 97% 97%    Wt Readings from Last 3 Encounters:  06/30/15 86.183 kg (190 lb)  06/11/15 89.359 kg (197 lb)  05/28/15 85.276 kg (188 lb)    General:  Well developed black male. Appears calm,  and comfortable Eyes: PER, normal lids, irises & conjunctiva ENT: grossly normal hearing, lips & tongue Neck: no LAD, no masses Cardiovascular: RRR, no murmurs, 1+ BLE edema.  Respiratory: Respirations even and unlabored. Normal respiratory effort. Lungs CTA bilaterally, no wheezes / rales .   Abdomen: soft, non-distended, mild diffuse tenderness, most predominant in RLQ, active bowel sounds. No obvious masses.  Skin: scattered papular lesions with excoriation Musculoskeletal: grossly normal tone BUE/BLE Psychiatric: groggy but cooperative and pleasant, speech fluent and appropriate Neurologic: grossly non-focal. No asterixis         LABS ON ADMISSION:    Basic Metabolic Panel:  Recent Labs Lab 07/01/15 0128  NA 134*  K 3.9  CL 103  CO2 22  GLUCOSE 172*  BUN 33*  CREATININE 1.64*  CALCIUM 9.0   Liver Function Tests:  Recent Labs Lab 07/01/15 0128  AST 220*  ALT 176*  ALKPHOS 525*  BILITOT 3.5*  PROT 8.8*  ALBUMIN 3.1*    CBC:  Recent Labs Lab 07/01/15 0128  WBC 11.3*  NEUTROABS 6.9  HGB 12.0*  HCT 34.7*  MCV 83.6  PLT 367   CREATININE: 1.64 mg/dL ABNORMAL (16/10/96 0454) Estimated creatinine clearance - 45.7 mL/min   ASSESSMENT / PLAN   Nausea with vomiting / abdominal pain. Description of diffuse abdominal pain is vague. He has no diarrhea to suggest gastroenteritis. Patient is supposed to have an MRI of the abdomen and  possible biopsy of liver lesions at [facility] soon. No appreciable abdominal distention and abdominal exam not overly concerning -admit for observation -obtain ultrasound evaluate for ascites. Also he does have known cholelithiaisis so gallbladder and bile ducts can be evaluate at same time.  -Gentle IVF given nausea, vomiting and acute on chronic kidney failure -am BMET -Hopefully home in early am so he can make PCP appointment in [facility]  Hepatic cirrhosis. Hx of Hepatitis C and ETOH. No evidence for decompensated cirrhosis at this point. No alcohol in 9 years  Abnormal LFTs, hepatocellular and cholestatic. He has known liver lesions, one of which is suspicious for a small HCC (followed by [facility]). His bilirubin isslightly higher than previous but otherwise his numbers are  about at baseline, especially when taking [facility] labs into the consideration. Patient needs to follow up with GI in [facility] who plans for liver MRI and probable biopsy.     Pruritus ,diffuse papular lesions with area of excoriation. Being treated for scabies by [facility] PCP who he has a follow up with tomorrow. Patient states he is using prescribed lotion. Itching is unbearable for him. Doubt bilirubin contributing factor.  -vistaril prn  Type 2 diabetes.  -Hold home insulin until sure able to tolerate PO -SSI  Hypertension. Stable.  -home home ACEI for now given acute on chronic renal failure -prn hydralazine  GERD -continue home PPI -prn hydralazine  CONSULTANTS:    None  Code Status: ful code DVT Prophylaxis: Lovenox Family Communication:  Patient alert, oriented and understands plan of care.  Disposition Plan: Discharge to home in 24 hours   Time spent: 60 minutes Willette Cluster  NP [facility] Pager [phone]

## 2015-07-01 NOTE — ED Provider Notes (Signed)
TIME SEEN: 1:00 AM  CHIEF COMPLAINT: Upper abdominal pain, vomiting, pruritic rash  HPI: Pt is a 65 y.o. male with history of diabetes, hepatitis C, hypertension, liver cirrhosis who presents to the emergency department with complaints of right upper quadrant abdominal pain that started today that he is unable to describe. He states he has had 3 episodes of nonbloody, nonbilious vomiting. No diarrhea. Also reports a pruritic rash that he has had for several months. Was seen in the emergency department on February 9 and started on doxepin with some relief. States he was seen by a dermatologist on February 14 and started on steroid creams. Reports his itching is progressively worsening and he cannot stop scratching. States he is having some areas that are bleeding now. Denies any fevers or chills. Status post appendectomy. Is unclear if he is followed by gastroenterologist. PCP is Dr. Willa Rough with [facility].  ROS: See HPI; limited as patient is a very poor historian Constitutional: no fever  Eyes: no drainage  ENT: no runny nose   Cardiovascular:  no chest pain  Resp: no SOB  GI:  vomiting GU: no dysuria Integumentary: no rash  Allergy: no hives  Musculoskeletal: no leg swelling  Neurological: no slurred speech ROS otherwise negative  PAST MEDICAL HISTORY/PAST SURGICAL HISTORY:  Past Medical History  Diagnosis Date  . Diabetes mellitus without complication (HCC)   . Hepatitis C   . Cirrhosis of liver (HCC)   . Hypertension     MEDICATIONS:  Prior to Admission medications   Medication Sig Start Date End Date Taking? Authorizing Provider  cephALEXin (KEFLEX) 500 MG capsule Take 1 capsule (500 mg total) by mouth 2 (two) times daily. 05/28/15   Tilden Fossa, MD  doxepin (SINEQUAN) 50 MG capsule Take 1 tablet at bedtime daily as needed for itching. May cause drowsiness, dry mouth or urinary retention. 06/11/15   John Molpus, MD  LANTUS SOLOSTAR 100 UNIT/ML Solostar Pen Inject 60-80 Units into the  skin 2 (two) times daily. 80 units in the morning, 60 units in the evening 01/01/15   Historical Provider, MD  lisinopril-hydrochlorothiazide (PRINZIDE,ZESTORETIC) 20-25 MG per tablet Take 1 tablet by mouth daily. 01/01/15   Historical Provider, MD  LYRICA 100 MG capsule Take 100 mg by mouth 3 (three) times daily. 01/15/15   Historical Provider, MD  omeprazole (PRILOSEC) 40 MG capsule Take 40 mg by mouth daily. 01/14/15   Historical Provider, MD  oxyCODONE (ROXICODONE) 5 MG immediate release tablet Take 1 tablet (5 mg total) by mouth every 6 (six) hours as needed for severe pain. 05/18/15   Trixie Dredge, PA-C    ALLERGIES:  Allergies  Allergen Reactions  . Benadryl [Diphenhydramine Hcl]     Constipation     SOCIAL HISTORY:  Social History  Substance Use Topics  . Smoking status: Former Games developer  . Smokeless tobacco: Not on file  . Alcohol Use: No    FAMILY HISTORY: No family history on file.  EXAM: BP 147/88 mmHg  Pulse 91  Temp(Src) 98.1 F (36.7 C) (Oral)  Resp 18  Ht  (1.778 m)  Wt 190 lb (86.183 kg)  BMI 27.26 kg/m2  SpO2 97% CONSTITUTIONAL: Alert and oriented and responds appropriately to questions. Chronically ill-appearing, appears uncomfortable HEAD: Normocephalic EYES: Conjunctivae clear, PERRL ENT: normal nose; no rhinorrhea; moist mucous membranes NECK: Supple, no meningismus, no LAD  CARD: RRR; S1 and S2 appreciated; no murmurs, no clicks, no rubs, no gallops RESP: Normal chest excursion without splinting or tachypnea;  breath sounds clear and equal bilaterally; no wheezes, no rhonchi, no rales, no hypoxia or respiratory distress, speaking full sentences ABD/GI: Normal bowel sounds; non-distended; soft, tender in the right upper quadrant epigastric region with negative Murphy sign, no rebound, no guarding, no peritoneal signs BACK:  The back appears normal and is non-tender to palpation, there is no CVA tenderness EXT: Normal ROM in all joints; non-tender to  palpation; no edema; normal capillary refill; no cyanosis, no calf tenderness or swelling    SKIN: Normal color for age and race; warm; maculopapular lesions noted to the extremities and torso without surrounding erythema or warmth. There are areas of excoriation with bleeding. No vesicles. No blisters or desquamation. No petechiae or purpura. No rash involving the palms, soles or mucous membranes.  See pictures below. NEURO: Moves all extremities equally, sensation to light touch intact diffusely, cranial nerves II through XII intact PSYCH: The patient's mood and manner are appropriate. Grooming and personal hygiene are appropriate.  MEDICAL DECISION MAKING: Patient here with complaints of abdominal pain. It appears per his records he has had a abdominal ultrasound September 2016 that showed possible liver mass concerning for hepatocellular carcinoma. It is difficult to obtain information from patient as he is a very poor historian whether or not he has seen a gastroenterologist for this but he does deny liver biopsy. He underwent CT scan in January 2017 which showed cirrhosis but no mass. At that time he was found to have gallstones but no signs of cholecystitis.  We'll repeat abdominal labs today. Low suspicion for cholecystitis or choledocholithiasis based on his abdominal exam. He is tender in the right upper quadrant but has a negative Murphy sign is not febrile. I do not feel he needs another CT of his abdomen given he has had one approximately one month ago.  Patient declines pain medication. States he wants something for itching. Reports Vistaril is not helping. Reports allergy to Benadryl. Will give dose of Ativan and reassess.   As for patient's rash, does not appear life-threatening and there are no signs of superimposed infection. Reports he has seen a dermatologist and is supposed to have follow-up but he cannot come me when. States he has never had these lesions biopsied. Suspect his pruritus  may be secondary to worsening liver function tests.  ED PROGRESS: Patient's labs show mild leukocytosis without left shift. He does have chronic kidney disease which appears to be baseline. His LFTs however are worse than normal. His meld score in September 2016 was 14 and today is 63. I feel given his history of gallstones or worsening liver function test he will need an ultrasound of his gallbladder and liver which we cannot obtain at this time. I feel he will need transfer to another hospital for this. Again it is unclear if he has had gastroenterology follow-up. Discussed with Dr. Antionette Char with hospitalist service who agrees on admission to medical bed, observation and will Place order for ultrasound upon patient arrival.  Patient comfortable with this plan to transfer to [facility] for further evaluation.             Layla Maw Austina Constantin, DO 07/01/15 [phone]

## 2015-07-02 ENCOUNTER — Observation Stay (HOSPITAL_COMMUNITY): Payer: Medicare Other

## 2015-07-02 DIAGNOSIS — R7989 Other specified abnormal findings of blood chemistry: Secondary | ICD-10-CM | POA: Diagnosis not present

## 2015-07-02 DIAGNOSIS — N179 Acute kidney failure, unspecified: Secondary | ICD-10-CM

## 2015-07-02 DIAGNOSIS — N189 Chronic kidney disease, unspecified: Secondary | ICD-10-CM

## 2015-07-02 DIAGNOSIS — R1011 Right upper quadrant pain: Secondary | ICD-10-CM | POA: Diagnosis not present

## 2015-07-02 DIAGNOSIS — N183 Chronic kidney disease, stage 3 (moderate): Secondary | ICD-10-CM | POA: Diagnosis not present

## 2015-07-02 LAB — HEMOGLOBIN A1C
HEMOGLOBIN A1C: 7.7 % — AB (ref 4.8–5.6)
Mean Plasma Glucose: 174 mg/dL

## 2015-07-02 LAB — GLUCOSE, CAPILLARY
GLUCOSE-CAPILLARY: 177 mg/dL — AB (ref 65–99)
GLUCOSE-CAPILLARY: 149 mg/dL — AB (ref 65–99)
Glucose-Capillary: 117 mg/dL — ABNORMAL HIGH (ref 65–99)

## 2015-07-02 LAB — BASIC METABOLIC PANEL
ANION GAP: 9 (ref 5–15)
BUN: 24 mg/dL — ABNORMAL HIGH (ref 6–20)
CALCIUM: 8.9 mg/dL (ref 8.9–10.3)
CO2: 22 mmol/L (ref 22–32)
Chloride: 106 mmol/L (ref 101–111)
Creatinine, Ser: 1.52 mg/dL — ABNORMAL HIGH (ref 0.61–1.24)
GFR, EST AFRICAN AMERICAN: 53 mL/min — AB (ref >60)
GFR, EST NON AFRICAN AMERICAN: 46 mL/min — AB (ref >60)
Glucose, Bld: 159 mg/dL — ABNORMAL HIGH (ref 65–99)
Potassium: 4.1 mmol/L (ref 3.5–5.1)
Sodium: 137 mmol/L (ref 135–145)

## 2015-07-02 LAB — HIV ANTIBODY (ROUTINE TESTING W REFLEX): HIV Screen 4th Generation wRfx: NONREACTIVE

## 2015-07-02 LAB — RPR: RPR Ser Ql: NONREACTIVE

## 2015-07-02 MED ORDER — HYDROXYZINE HCL 25 MG PO TABS: 50.0000 mg | ORAL_TABLET | Freq: Four times a day (QID) | ORAL | Status: DC | PRN

## 2015-07-02 MED ORDER — HYDROXYZINE HCL 25 MG PO TABS
25.0000 mg | ORAL_TABLET | ORAL | Status: AC
  Administered 2015-07-02: 25 mg via ORAL
  Filled 2015-07-02: qty 1

## 2015-07-02 MED ORDER — LACTULOSE 10 GM/15ML PO SOLN: 30.0000 g | Freq: Two times a day (BID) | ORAL | Status: AC | PRN

## 2015-07-02 MED ORDER — LACTULOSE 10 GM/15ML PO SOLN
30.0000 g | Freq: Once | ORAL | Status: AC
  Administered 2015-07-02: 30 g via ORAL
  Filled 2015-07-02: qty 45

## 2015-07-02 MED ORDER — HYDROXYZINE HCL 50 MG/ML IM SOLN: 50.0000 mg | Freq: Four times a day (QID) | INTRAMUSCULAR | Status: DC | PRN

## 2015-07-02 MED ORDER — BISACODYL 10 MG RE SUPP
10.0000 mg | Freq: Once | RECTAL | Status: AC
  Administered 2015-07-02: 10 mg via RECTAL
  Filled 2015-07-02: qty 1

## 2015-07-02 MED ORDER — TRIAMCINOLONE ACETONIDE 0.1 % EX CREA: TOPICAL_CREAM | Freq: Three times a day (TID) | CUTANEOUS | Status: AC

## 2015-07-02 MED ORDER — DIPHENHYDRAMINE HCL 50 MG/ML IJ SOLN: 25.0000 mg | Freq: Once | INTRAMUSCULAR | Status: DC

## 2015-07-02 MED ORDER — TRIAMCINOLONE ACETONIDE 0.1 % EX CREA
TOPICAL_CREAM | Freq: Three times a day (TID) | CUTANEOUS | Status: DC
  Administered 2015-07-02: 1 via TOPICAL
  Filled 2015-07-02: qty 15

## 2015-07-02 NOTE — Progress Notes (Signed)
Up to BR had 2 semiformed stools after dulcolax supp and Lactolose

## 2015-07-02 NOTE — Discharge Summary (Signed)
Physician Discharge Summary  Bobby Morse JXB:[id number] DOB: 24-Feb-1951 DOA: 07/01/2015  PCP: Agustina Caroli, MD  Admit date: 07/01/2015 Discharge date: 07/02/2015  Time spent: 20 minutes  Recommendations for Outpatient Follow-up:  1. Follow up with PCP as already scheduled 2. Patient instructed to follow up with GI/Hepatology to continue work up of liver disease  Discharge Diagnoses:  Principal Problem:   Abdominal pain Active Problems:   Transaminitis   Hepatic cirrhosis (HCC)   Nausea with vomiting   Abnormal LFTs   Pruritus   DM (diabetes mellitus) type 2, uncontrolled, with ketoacidosis (HCC)   Acute on chronic renal failure (HCC)   CKD (chronic kidney disease), stage III   Cirrhosis (HCC)   Emesis   Pruritic rash   Discharge Condition: Improved  Diet recommendation: Diabetic  Filed Weights   06/30/15 2239  Weight: 86.183 kg (190 lb)    History of present illness:  Please review dictated H and P from 3/1 for details. Briefly, 65 y.o. male with HCV cirrhosis, history of alcohol abuse, and asthma. Patient is followed at ;[facility]. He did not complete HCV treatment with [facility]. Patient has known liver lesions followed at [facility]. The largest lesion felt to be benign, a smaller one possibly HCC. AFP in Sept was in the 400s. Patient states that [facility] plans a follow-up MRI and biopsy of the liver lesions but this has not been yet scheduled.  Hospital Course:  Nausea with vomiting / abdominal pain. Description of diffuse abdominal pain is vague. He has no diarrhea to suggest gastroenteritis. Patient is supposed to have an MRI of the abdomen and possible biopsy of liver lesions at [facility] soon. No appreciable abdominal distention and abdominal exam not overly concerning -Patient was admitted for observation -RUQ ultrasound demonstrated stable cirrhosis -Gentle IVF given nausea, vomiting and acute on chronic kidney failure -Abd xray done. Per my own read,  patient noted to have stool throughout colon. Patient's abd pain improved after multiple bowel movements aided by lactulose and dulcolax suppository  Hepatic cirrhosis. Hx of Hepatitis C and ETOH. No evidence for decompensated cirrhosis at this point. No alcohol in 9 years  Abnormal LFTs, hepatocellular and cholestatic. He has known liver lesions, one of which is suspicious for a small HCC (followed by [facility]). His bilirubin isslightly higher than previous but otherwise his numbers are about at baseline, especially when taking [facility] labs into the consideration. Patient needs to follow up with GI in [facility] who plans for liver MRI and probable biopsy.    Pruritus ,diffuse papular lesions with area of excoriation. Being treated for scabies by [facility] PCP who he has a follow up with tomorrow. Patient states he is using prescribed lotion. Itching is unbearable for him. Doubt bilirubin contributing factor.  -improved with triamcinolone cream  Type 2 diabetes.  -Hold home insulin until sure able to tolerate PO -SSI -resume home meds on discharge  Hypertension. Stable.  -home home ACEI for now given acute on chronic renal failure -prn hydralazine  GERD -continue home PPI -prn hydralazine  Discharge Exam: Filed Vitals:   07/01/15 1600 07/01/15 2138 07/02/15 0555 07/02/15 1226  BP: 143/79 128/80 116/70 137/80  Pulse: 86 79 83 82  Temp: 98.5 F (36.9 C) 98.8 F (37.1 C) 98.7 F (37.1 C) 97.9 F (36.6 C)  TempSrc:      Resp: Height:      Weight:      SpO2: 100%  100% 99% 100%    General: Awake, in nad Cardiovascular: regular, s1, s2 Respiratory: normal resp effort, no wheezing  Discharge Instructions     Medication List    STOP taking these medications        cephALEXin 500 MG capsule  Commonly known as:  KEFLEX     oxyCODONE 5 MG immediate release tablet  Commonly known as:  ROXICODONE      TAKE these medications        doxepin 50  MG capsule  Commonly known as:  SINEQUAN  Take 1 tablet at bedtime daily as needed for itching. May cause drowsiness, dry mouth or urinary retention.     lactulose 10 GM/15ML solution  Commonly known as:  CHRONULAC  Take 45 mLs (30 g total) by mouth 2 (two) times daily as needed for moderate constipation.     LANTUS SOLOSTAR 100 UNIT/ML Solostar Pen  Generic drug:  Insulin Glargine  Inject 60-80 Units into the skin 2 (two) times daily. 80 units in the morning, 60 units in the evening     lisinopril-hydrochlorothiazide 20-25 MG tablet  Commonly known as:  PRINZIDE,ZESTORETIC  Take 1 tablet by mouth daily.     LYRICA 100 MG capsule  Generic drug:  pregabalin  Take 100 mg by mouth 3 (three) times daily.     omeprazole 40 MG capsule  Commonly known as:  PRILOSEC  Take 40 mg by mouth daily.     triamcinolone cream 0.1 %  Commonly known as:  KENALOG  Apply topically 3 (three) times daily.       Allergies  Allergen Reactions  . Benadryl [Diphenhydramine Hcl]     Constipation    Follow-up Information    Follow up with HICKS, KRISTIN D, MD. Schedule an appointment as soon as possible for a visit in 1 week.   Specialty:  Internal Medicine      Follow up with Follow up with your GI/Liver specialist as scheduled.   Why:  Hospital follow up       The results of significant diagnostics from this hospitalization (including imaging, microbiology, ancillary and laboratory) are listed below for reference.    Significant Diagnostic Studies: US Abdomen Complete  07/01/2015  CLINICAL DATA:  Abdominal pain and cirrhosis EXAM: ABDOMEN ULTRASOUND COMPLETE COMPARISON:  05/18/2015, 01/30/2015 FINDINGS: Gallbladder: Well distended. No gallstones or gallbladder wall thickening is seen. Common bile duct: Diameter: 4.8 mm. Liver: Nodular and increased in echogenicity consistent with underlying cirrhotic change. A hyperechoic focus is noted within the right lobe of the liver. This may represent a  hemangioma. Fullness in the region of the caudate lobe is seen suggestive of a mass but may simply represent hypertrophy of the more normal caudate lobe due to the cirrhotic change. This is stable from the recent MRI from 01/30/2015 IVC: No abnormality visualized. Pancreas: Visualized portion unremarkable. Spleen: Size and appearance within normal limits. Right Kidney: Length: 11 cm. Echogenicity within normal limits. No mass or hydronephrosis visualized. Left Kidney: Length: 11.5 cm. Echogenicity within normal limits. No mass or hydronephrosis visualized. Abdominal aorta: No aneurysm visualized. Other findings: None. IMPRESSION: Cirrhotic change in the liver. There is apparent hypertrophy of the caudate lobe similar to that seen on prior MRI examination. A small 1 cm hyperechoic focus is noted within the right lobe of the liver which was not as well on the previous MRI examination. This may represent a small hemangioma or dysplastic nodule. Continued followup is recommended. Electronically Signed   By:  Alcide Clever M.D.   On: 07/01/2015 15:20   Dg Abd Portable 1v  07/02/2015  CLINICAL DATA:  Constipation, history of hepatic cirrhosis, hepatitis-C, and diabetes, gallstones EXAM: PORTABLE ABDOMEN - 1 VIEW COMPARISON:  Abdominal and pelvic CT scan of May 18, 2015 FINDINGS: The bowel gas pattern is within the limits of normal. There are no abnormal soft tissue calcifications observed. There is atherosclerotic calcification in the common iliac artery on the left. The there is degenerative change centered in the lower lumbar spine. The lung bases are clear. A curvilinear tubular structure is present over the left mid abdomen. IMPRESSION: No acute intra-abdominal abnormality is observed. Electronically Signed   By: David  [place] M.D.   On: 07/02/2015 08:57    Microbiology: No results found for this or any previous visit (from the past 240 hour(s)).   Labs: Basic Metabolic Panel:  Recent Labs Lab  07/01/15 0128 07/02/15 0320  NA 134* 137  K 3.9 4.1  CL 103 106  CO2 22 22  GLUCOSE 172* 159*  BUN 33* 24*  CREATININE 1.64* 1.52*  CALCIUM 9.0 8.9   Liver Function Tests:  Recent Labs Lab 07/01/15 0128  AST 220*  ALT 176*  ALKPHOS 525*  BILITOT 3.5*  PROT 8.8*  ALBUMIN 3.1*    Recent Labs Lab 07/01/15 0128  LIPASE 30   No results for input(s): AMMONIA in the last 168 hours. CBC:  Recent Labs Lab 07/01/15 0128  WBC 11.3*  NEUTROABS 6.9  HGB 12.0*  HCT 34.7*  MCV 83.6  PLT 367   Cardiac Enzymes: No results for input(s): CKTOTAL, CKMB, CKMBINDEX, TROPONINI in the last 168 hours. BNP: BNP (last 3 results) No results for input(s): BNP in the last 8760 hours.  ProBNP (last 3 results) No results for input(s): PROBNP in the last 8760 hours.  CBG:  Recent Labs Lab 07/01/15 1625 07/01/15 2136 07/02/15 0620 07/02/15 1104 07/02/15 1612  GLUCAP 153* 170* 177* 149* 117*       Signed:  Evi Mccomb K  [facility] 07/02/2015, 4:49 PM

## 2015-07-04 ENCOUNTER — Emergency Department (HOSPITAL_BASED_OUTPATIENT_CLINIC_OR_DEPARTMENT_OTHER)
Admission: EM | Admit: 2015-07-04 | Discharge: 2015-07-04 | Disposition: A | Discharge status: Home / Self Care | Payer: Medicare Other | Attending: Emergency Medicine | Admitting: Emergency Medicine

## 2015-07-04 DIAGNOSIS — Z7952 Long term (current) use of systemic steroids: Secondary | ICD-10-CM | POA: Insufficient documentation

## 2015-07-04 DIAGNOSIS — Z794 Long term (current) use of insulin: Secondary | ICD-10-CM | POA: Diagnosis not present

## 2015-07-04 DIAGNOSIS — Z8719 Personal history of other diseases of the digestive system: Secondary | ICD-10-CM | POA: Insufficient documentation

## 2015-07-04 DIAGNOSIS — Z87891 Personal history of nicotine dependence: Secondary | ICD-10-CM | POA: Insufficient documentation

## 2015-07-04 DIAGNOSIS — Z79899 Other long term (current) drug therapy: Secondary | ICD-10-CM | POA: Insufficient documentation

## 2015-07-04 DIAGNOSIS — L282 Other prurigo: Secondary | ICD-10-CM

## 2015-07-04 DIAGNOSIS — I10 Essential (primary) hypertension: Secondary | ICD-10-CM | POA: Insufficient documentation

## 2015-07-04 DIAGNOSIS — Z8619 Personal history of other infectious and parasitic diseases: Secondary | ICD-10-CM | POA: Insufficient documentation

## 2015-07-04 DIAGNOSIS — L299 Pruritus, unspecified: Secondary | ICD-10-CM | POA: Insufficient documentation

## 2015-07-04 DIAGNOSIS — E119 Type 2 diabetes mellitus without complications: Secondary | ICD-10-CM | POA: Insufficient documentation

## 2015-07-04 MED ORDER — DOXEPIN HCL 50 MG PO CAPS: ORAL_CAPSULE | ORAL | Status: AC

## 2015-07-04 NOTE — ED Notes (Signed)
Pt c/o generalized itching and rash

## 2015-07-04 NOTE — ED Provider Notes (Signed)
CSN: [id number]     Arrival date & time 07/04/15  0051 History   First MD Initiated Contact with Patient 07/04/15 0146     Chief Complaint  Patient presents with  . Itching      (Consider location/radiation/quality/duration/timing/severity/associated sxs/prior Treatment) HPI  This is a 65 year old male with a several month history of a generalized pruritic maculopapular rash. He was seen by myself in February and placed on doxepin 50 milligrams daily at bedtime as the 100 milligrams of hydroxyzine he had been taking were inadequate. He has subsequently been seen by a dermatologist who has treated him with triamcinolone cream without adequate relief. He has been treated for scabies without relief, and on my previous encounter with him his rash did not appear to be consistent with scabies. He is here requesting something for the itching. He is very frustrated that no one has been able to identify the cause normal QRS him. He has follow-up pending with dermatology for biopsy.  He was recently admitted for abdominal pain and his itching was not felt to be due to hyperbilirubinemia.  Past Medical History  Diagnosis Date  . Diabetes mellitus without complication (HCC)   . Hepatitis C   . Cirrhosis of liver (HCC)   . Hypertension    Past Surgical History  Procedure Laterality Date  . Appendectomy    . Shoulder arthroscopy     Family History  Problem Relation Age of Onset  . Hypertension Maternal Grandfather   . Breast cancer Mother   . Colon cancer Maternal Grandfather    Social History  Substance Use Topics  . Smoking status: Former Games developermoker  . Smokeless tobacco: Not on file  . Alcohol Use: No    Review of Systems  All other systems reviewed and are negative.  Allergies  Benadryl  Home Medications   Prior to Admission medications   Medication Sig Start Date End Date Taking? Authorizing Provider  doxepin (SINEQUAN) 50 MG capsule Take 1 tablet at bedtime daily as needed for  itching. May cause drowsiness, dry mouth or urinary retention. 06/11/15   Kendahl Bumgardner, MD  lactulose (CHRONULAC) 10 GM/15ML solution Take 45 mLs (30 g total) by mouth 2 (two) times daily as needed for moderate constipation. 07/02/15   Jerald KiefStephen K Chiu, MD  LANTUS SOLOSTAR 100 UNIT/ML Solostar Pen Inject 60-80 Units into the skin 2 (two) times daily. 80 units in the morning, 60 units in the evening 01/01/15   Historical Provider, MD  lisinopril-hydrochlorothiazide (PRINZIDE,ZESTORETIC) 20-25 MG per tablet Take 1 tablet by mouth daily. 01/01/15   Historical Provider, MD  LYRICA 100 MG capsule Take 100 mg by mouth 3 (three) times daily. 01/15/15   Historical Provider, MD  omeprazole (PRILOSEC) 40 MG capsule Take 40 mg by mouth daily. 01/14/15   Historical Provider, MD  triamcinolone cream (KENALOG) 0.1 % Apply topically 3 (three) times daily. 07/02/15   Jerald KiefStephen K Chiu, MD   BP 161/96 mmHg  Pulse 93  Temp(Src) 98.8 F (37.1 C) (Oral)  Resp 16  Ht 5\' 10"  (1.778 m)  Wt 200 lb (90.719 kg)  BMI 28.70 kg/m2  SpO2 99%   Physical Exam  General: Well-developed, well-nourished male in no acute distress; appearance consistent with age of record HENT: normocephalic; atraumatic Eyes: pupils equal, round and reactive to light; extraocular muscles intact Neck: supple Heart: regular rate and rhythm Lungs: Normal respiratory effort and excursion Abdomen: soft; nondistended Extremities: No deformity; full range of motion Neurologic: Awake, alert and oriented; motor  function intact in all extremities and symmetric; no facial droop Skin: Warm and dry; generalized maculopapular rash with evidence of scratching Psychiatric: Flat affect    ED Course  Procedures (including critical care time)   MDM  Patient states the doxepin was helping his itching and he is requesting a refill. He was advised that we do not carry the drug in our ED and that he will have to get filled at a pharmacy.  Paula Libra, MD 07/04/15 [phone]

## 2016-01-01 DEATH — deceased

## 2016-07-25 IMAGING — US US ABDOMEN LIMITED
1 series · 14 of 25 positions shown · non-contrast
Comparison: Abdominal ultrasound February 20, 2008; abdominal MRI
October 16, 2008

CLINICAL DATA: Elevated liver enzymes

EXAM:
US ABDOMEN LIMITED - RIGHT UPPER QUADRANT

[Series 1: us abdomen limited · 0.25mm/px · 14 of 81 slices shown]
[im 1/81]
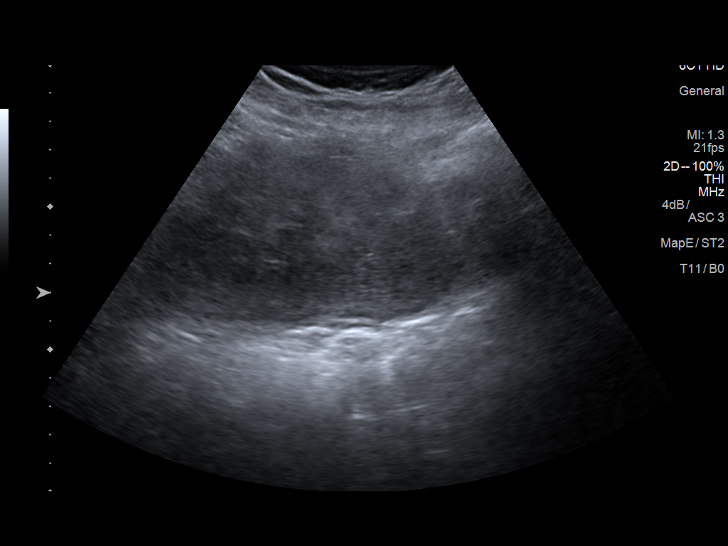
[im 7/81]
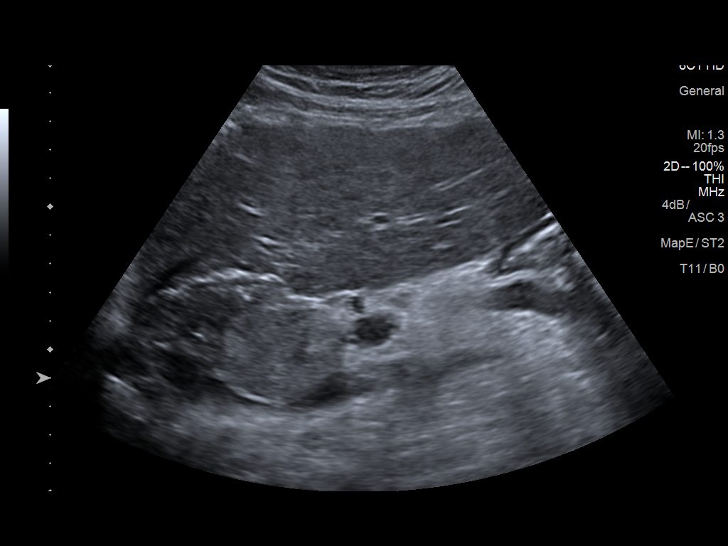
[im 14/81]
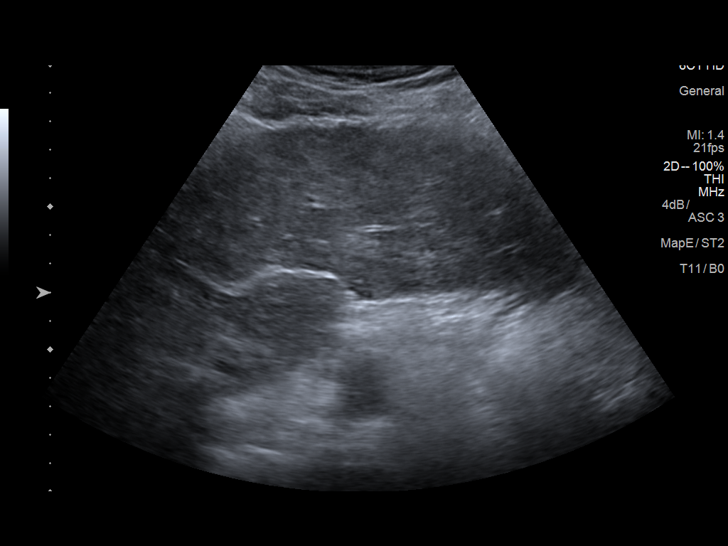
[im 21/81]
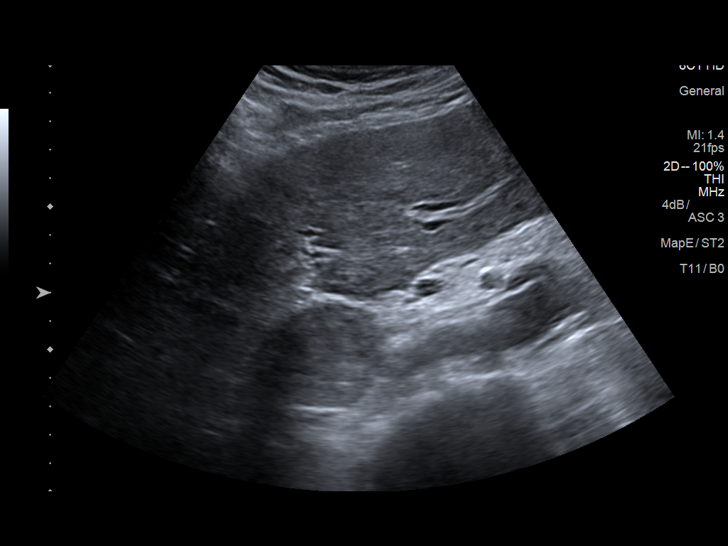
[im 27/81]
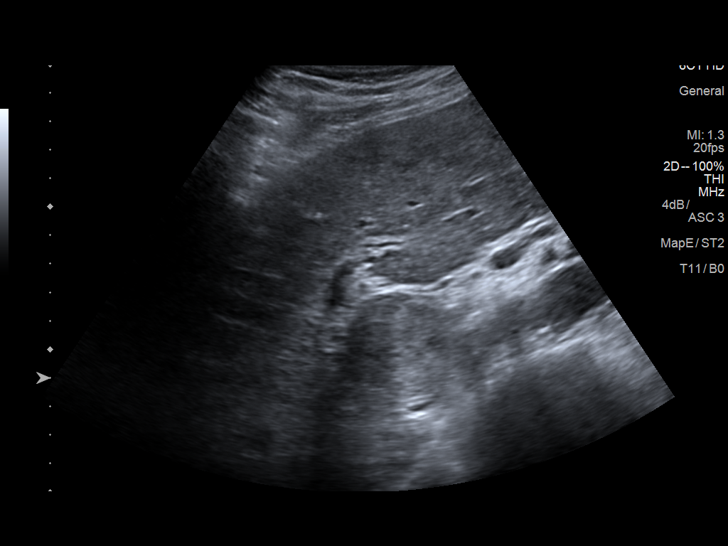
[im 31/81]
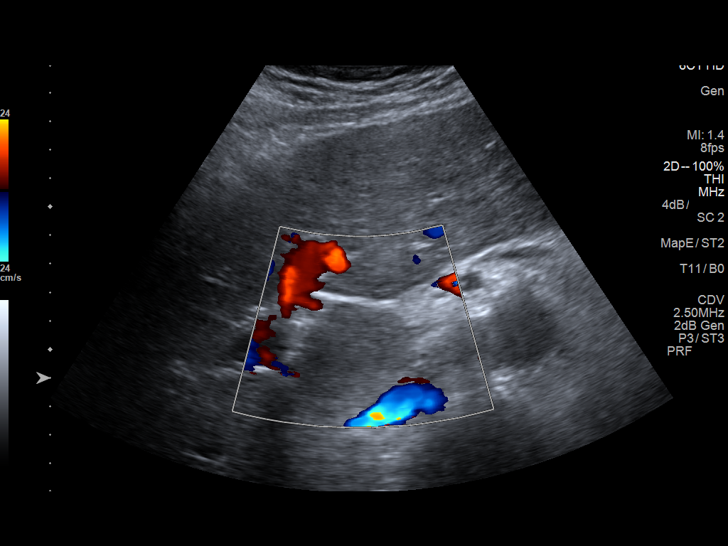
[im 37/81]
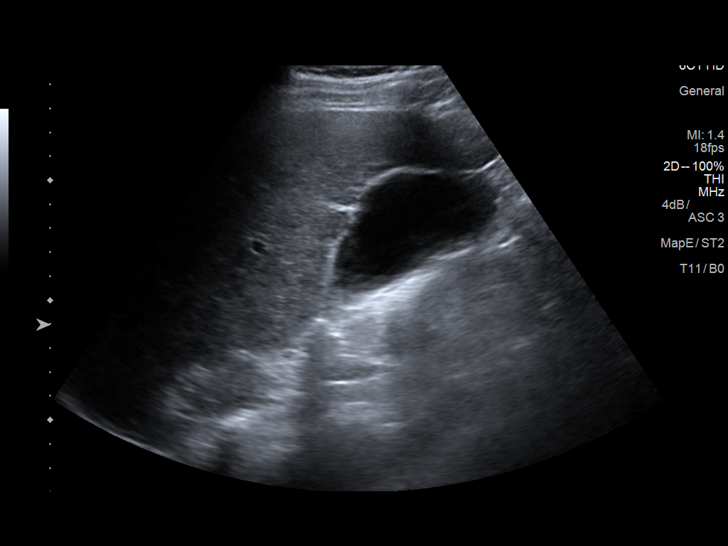
[im 44/81]
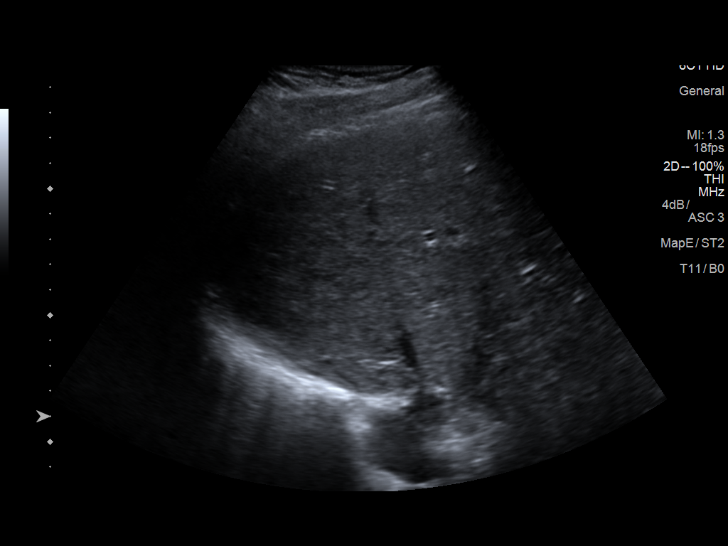
[im 51/81]
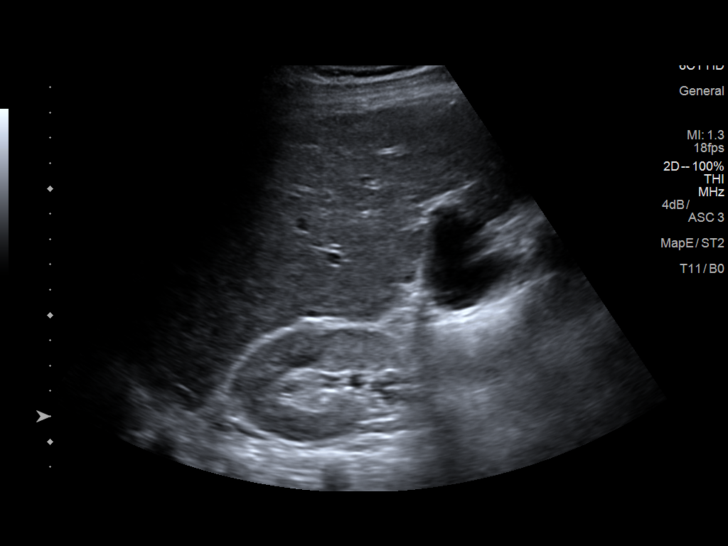
[im 54/81]
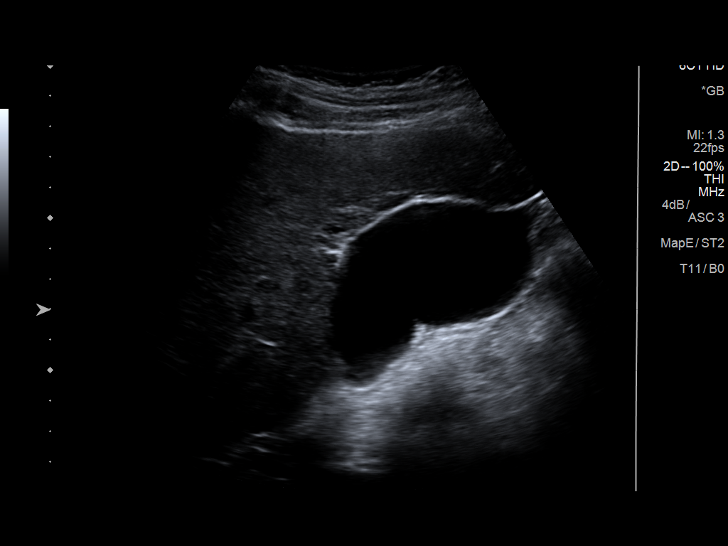
[im 61/81]
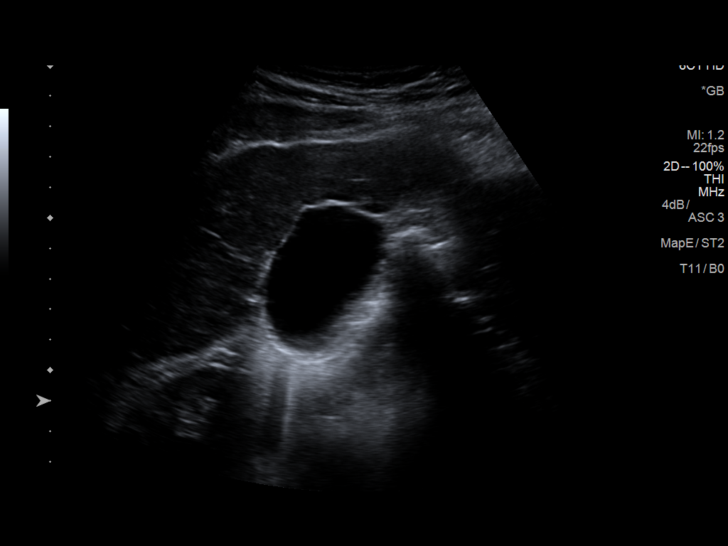
[im 67/81]
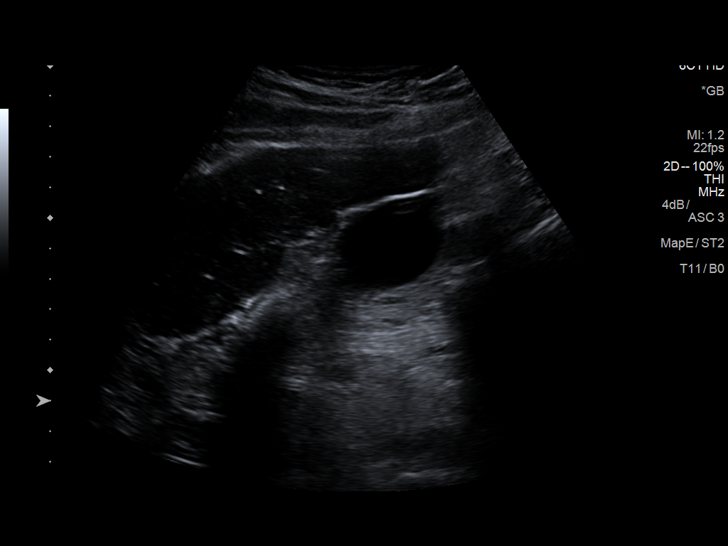
[im 74/81]
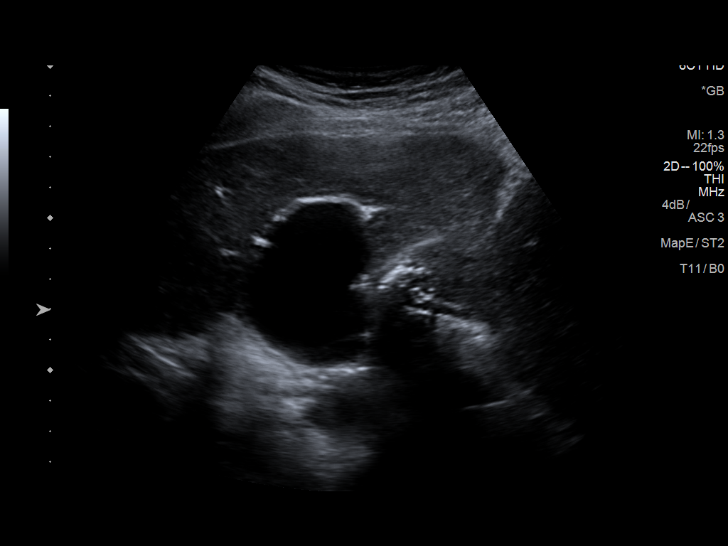
[im 81/81]
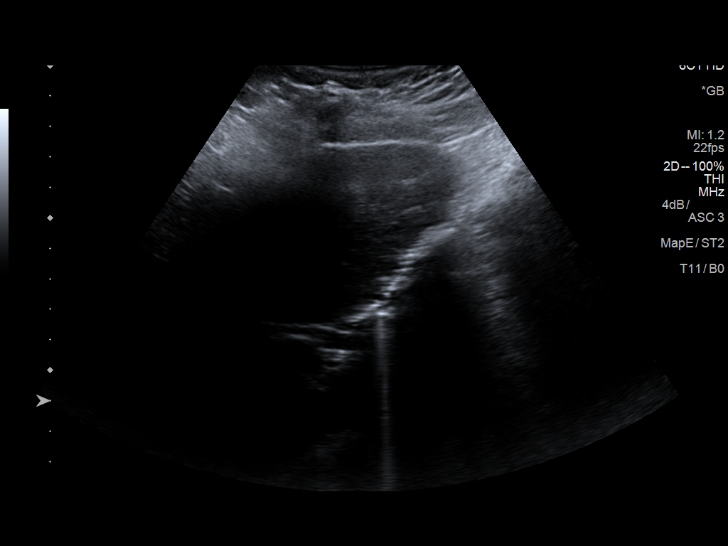

[14 of 25 positions shown; findings below may reference images not displayed]

FINDINGS: Gallbladder:

No gallstones or wall thickening visualized. There is no
pericholecystic fluid. No sonographic Murphy sign noted.

Common bile duct:

Diameter: 5 mm. There is no intrahepatic or extrahepatic biliary
duct dilatation.

Liver:

There is a solid mass arising in the caudate lobe region measuring
4.7 x 3.5 x 3.1 cm. Liver has a somewhat nodular contour with a
mildly increased echotexture, concerning for underlying cirrhosis.
IMPRESSION: Mass arising from caudate lobe of liver. Underlying changes
concerning for parenchymal disease/cirrhosis. Hepatocellular
carcinoma must be of concern given these findings. Would advise pre
and and multiphasic post-contrast MR or CT of the liver to further
evaluate. Study otherwise unremarkable.

These results were called by telephone at the time of interpretation
on 01/26/2015 at [DATE] to Dr. MARTINZ CATER , who verbally
acknowledged these results.

## 2016-12-28 IMAGING — US US ABDOMEN COMPLETE
1 series · 13 of 25 positions shown · non-contrast
Comparison: 05/18/2015, 01/30/2015

CLINICAL DATA: Abdominal pain and cirrhosis

EXAM:
ABDOMEN ULTRASOUND COMPLETE

[Series 1: us abdomen complete · 0.33mm/px · 13 of 96 slices shown]
[im 1/96]
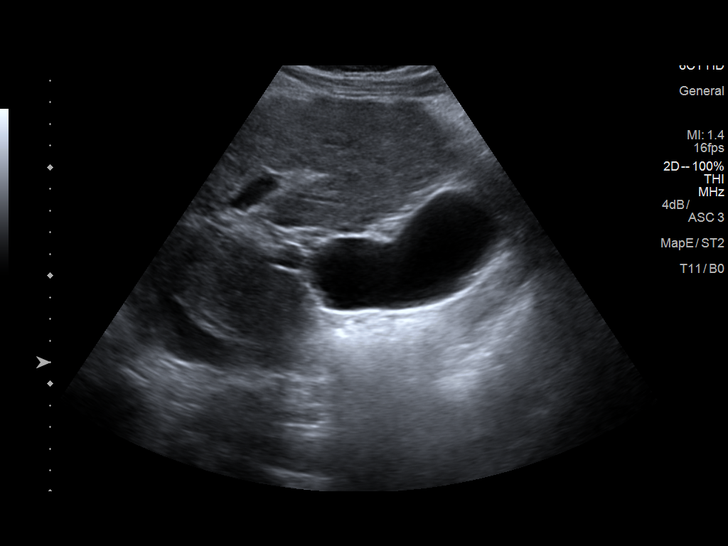
[im 8/96]
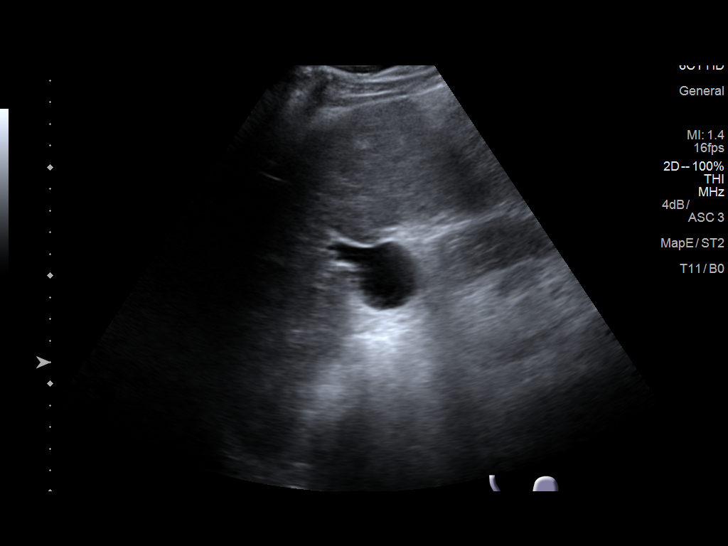
[im 16/96]
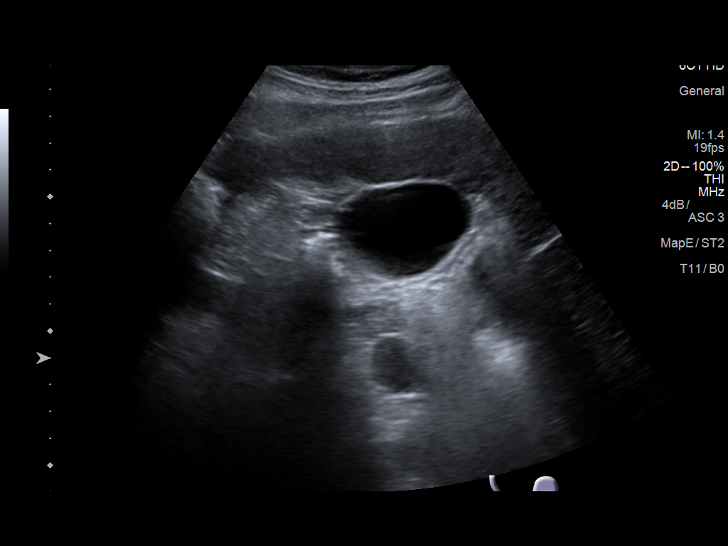
[im 24/96]
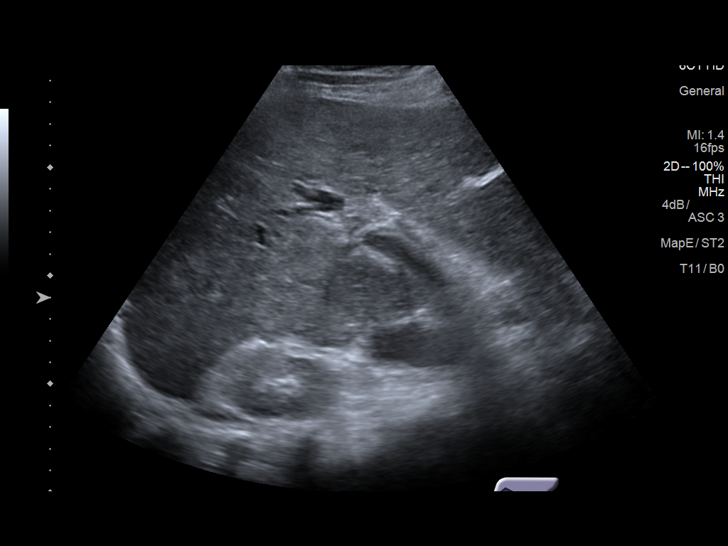
[im 32/96]
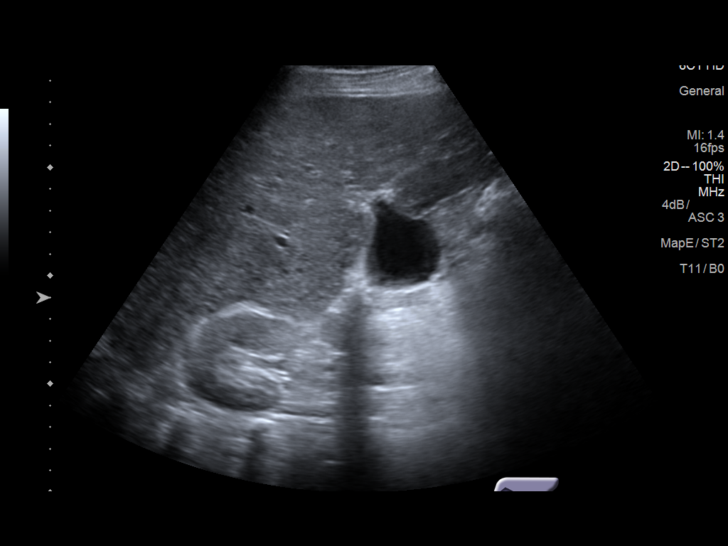
[im 40/96]
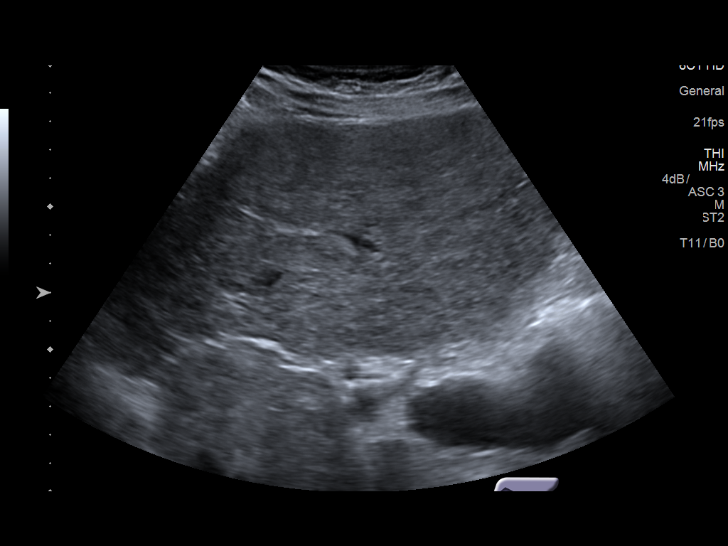
[im 48/96]
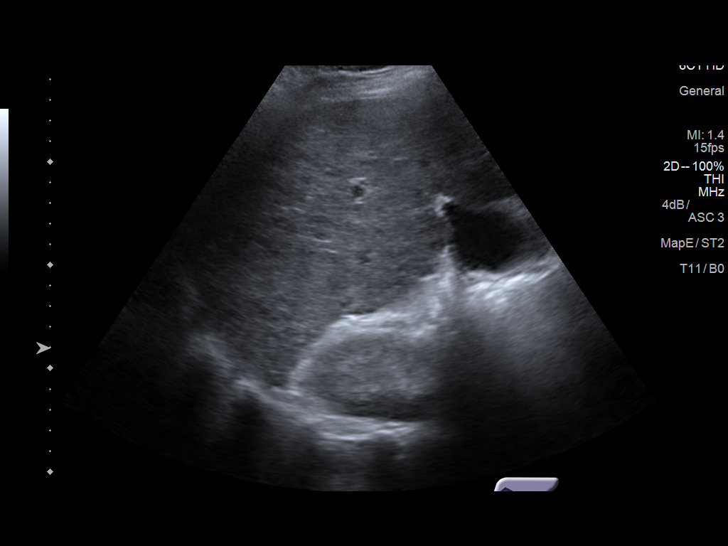
[im 56/96]
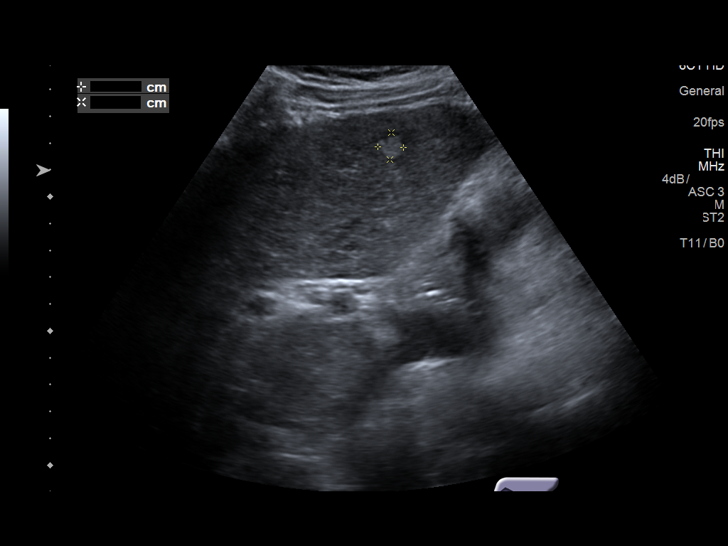
[im 64/96]
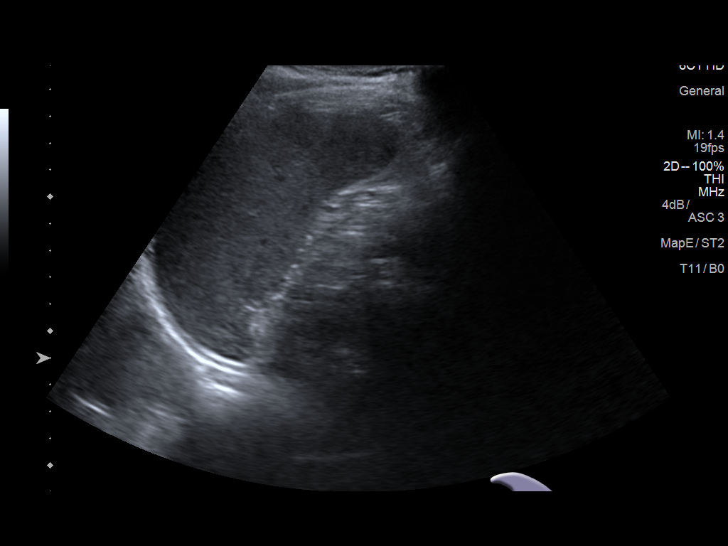
[im 72/96]
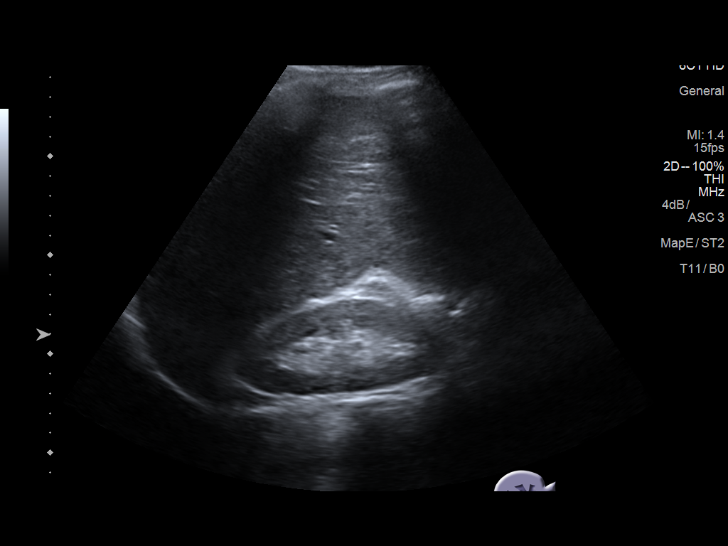
[im 80/96]
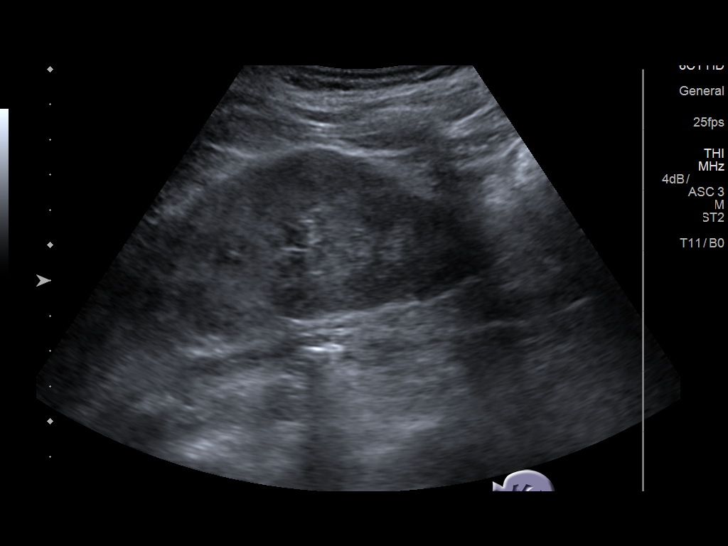
[im 88/96]
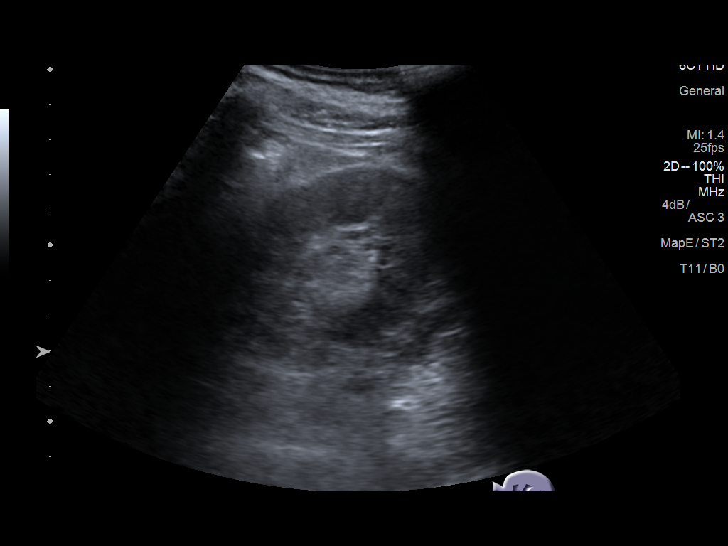
[im 96/96]
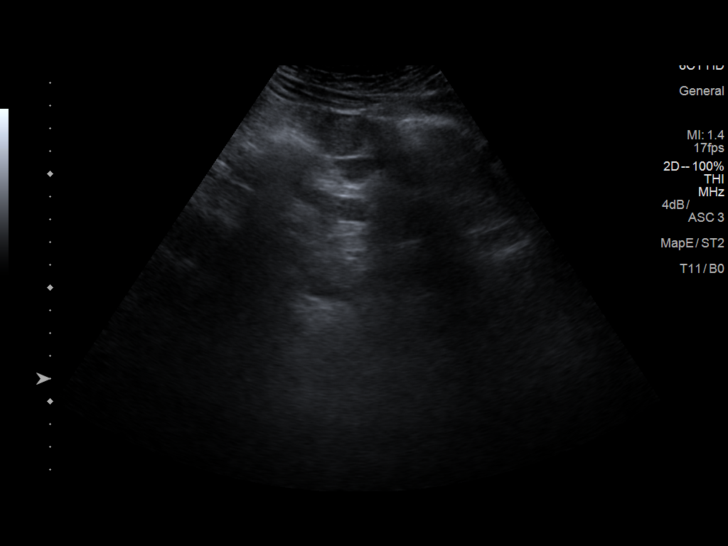

[13 of 25 positions shown; findings below may reference images not displayed]

FINDINGS: Gallbladder: Well distended. No gallstones or gallbladder wall
thickening is seen.

Common bile duct: Diameter: 4.8 mm.

Liver: Nodular and increased in echogenicity consistent with
underlying cirrhotic change. A hyperechoic focus is noted within the
right lobe of the liver. This may represent a hemangioma. Fullness
in the region of the caudate lobe is seen suggestive of a mass but
may simply represent hypertrophy of the more normal caudate lobe due
to the cirrhotic change. This is stable from the recent MRI from
01/30/2015

IVC: No abnormality visualized.

Pancreas: Visualized portion unremarkable.

Spleen: Size and appearance within normal limits.

Right Kidney: Length: 11 cm.. Echogenicity within normal limits. No
mass or hydronephrosis visualized.

Left Kidney: Length: 11.5 cm.. Echogenicity within normal limits. No
mass or hydronephrosis visualized.

Abdominal aorta: No aneurysm visualized.

Other findings: None.
IMPRESSION: Cirrhotic change in the liver. There is apparent hypertrophy of the
caudate lobe similar to that seen on prior MRI examination.

A small 1 cm hyperechoic focus is noted within the right lobe of the
liver which was not as well on the previous MRI examination. This
may represent a small hemangioma or dysplastic nodule. Continued
followup is recommended.

## 2017-08-30 IMAGING — CT CT ABD-PELV W/O CM
2 of 4 series · 16 of 46 positions shown, 18 images · non-contrast
Comparison: CT of the abdomen and pelvis from 03/25/2015, and MRI
of the lumbar spine performed 03/28/2015

CLINICAL DATA: Acute onset of bilateral flank and generalized
abdominal pain. Increased urinary frequency. Initial encounter.

EXAM:
CT ABDOMEN AND PELVIS WITHOUT CONTRAST
TECHNIQUE: Multidetector CT imaging of the abdomen and pelvis was performed
following the standard protocol without IV contrast.

[Series 2: axial st · axial · 0.83mm/px · z∈[-442,-32]mm · 13 of 90 slices shown, 15 images]
[im 4/90  soft-tissue]
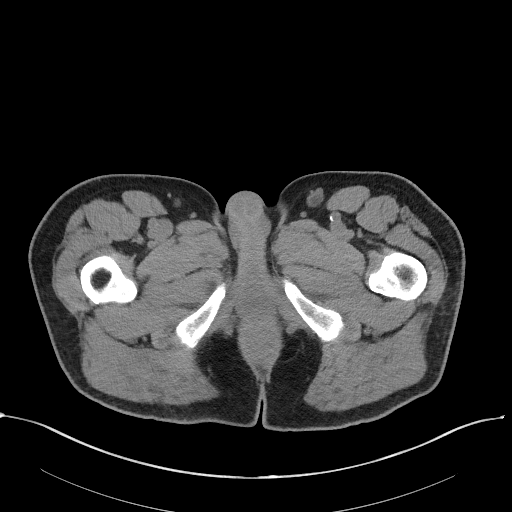
[im 4/90  bone]
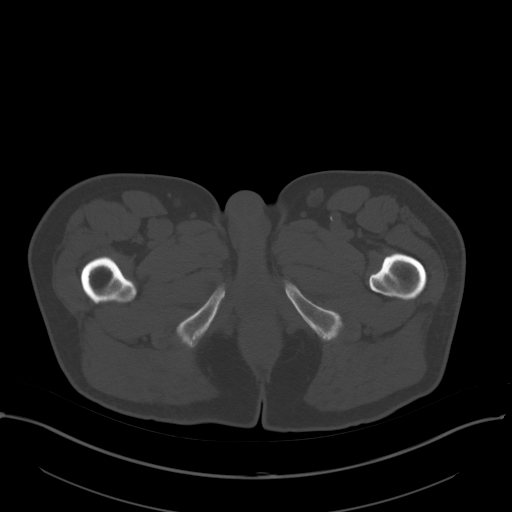
[im 12/90  soft-tissue]
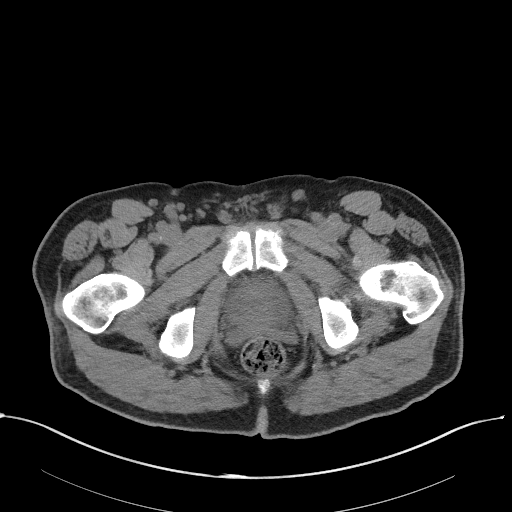
[im 19/90  soft-tissue]
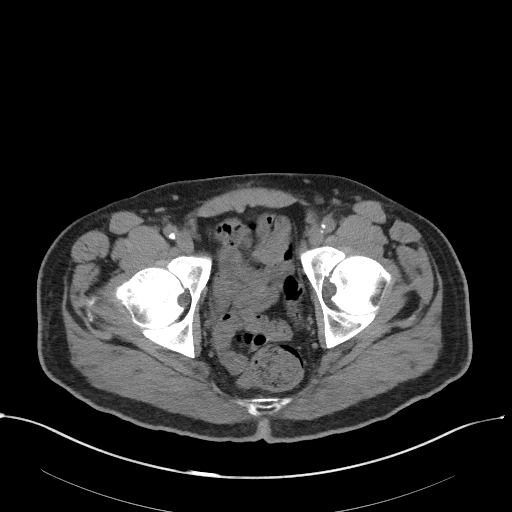
[im 26/90  soft-tissue]
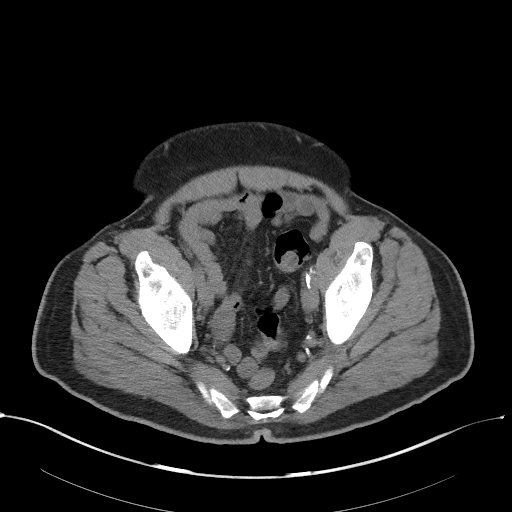
[im 30/90  soft-tissue]
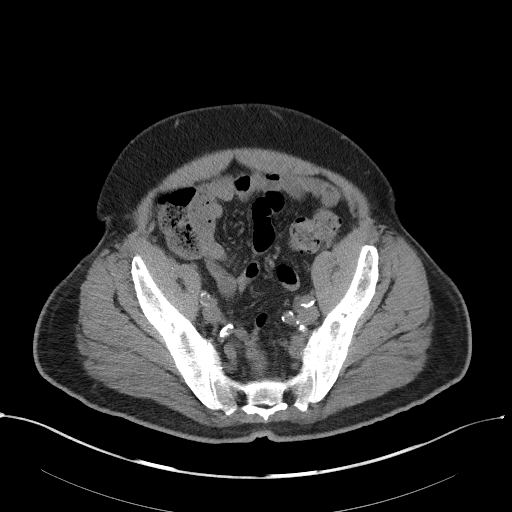
[im 38/90  soft-tissue]
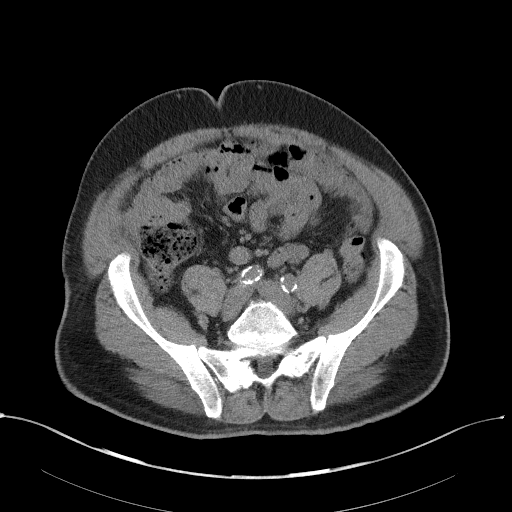
[im 45/90  soft-tissue]
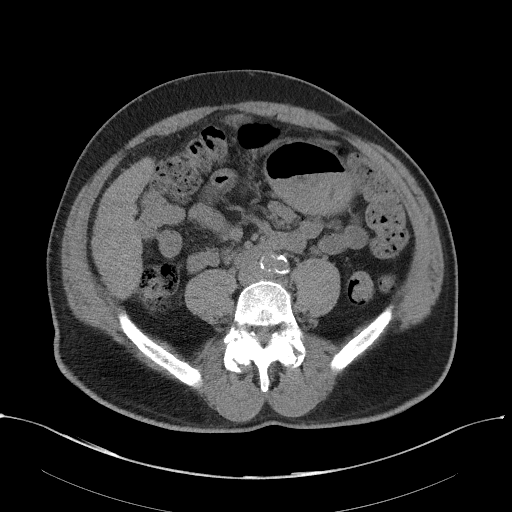
[im 52/90  soft-tissue]
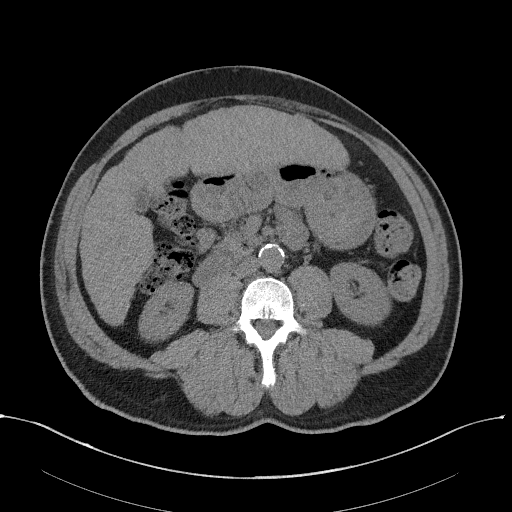
[im 60/90  soft-tissue]
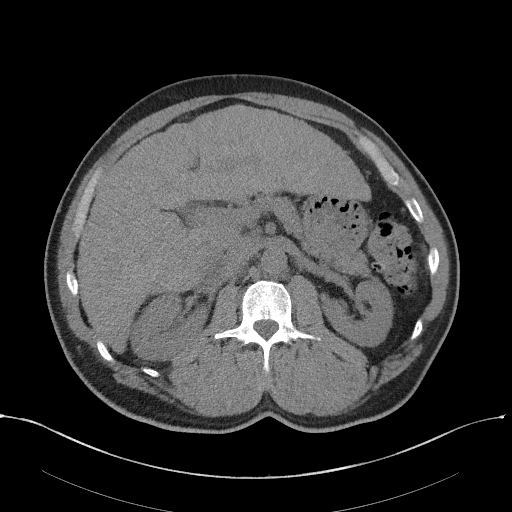
[im 60/90  bone]
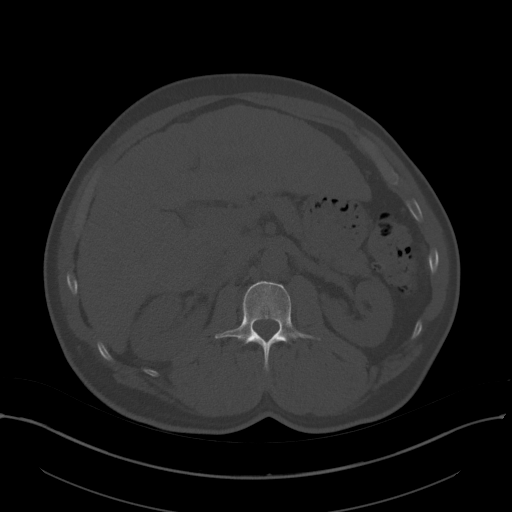
[im 64/90  soft-tissue]
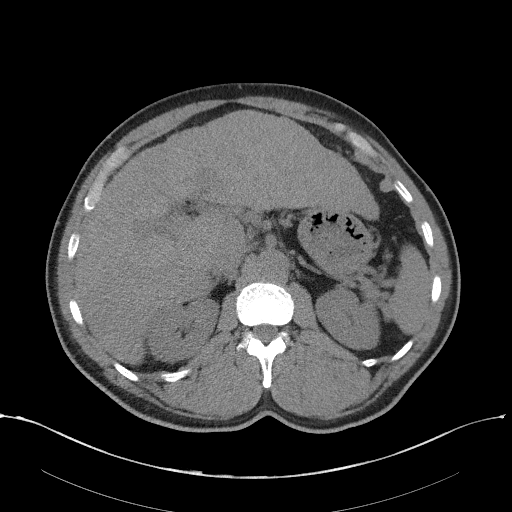
[im 71/90  soft-tissue]
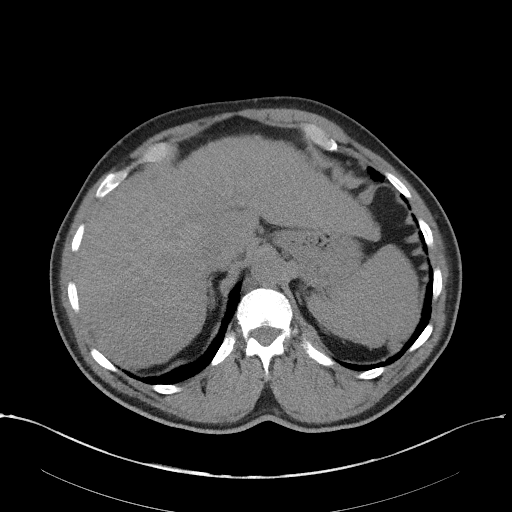
[im 78/90  soft-tissue]
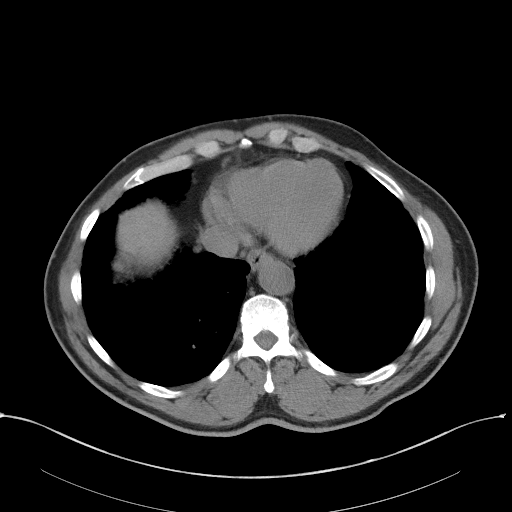
[im 86/90  soft-tissue]
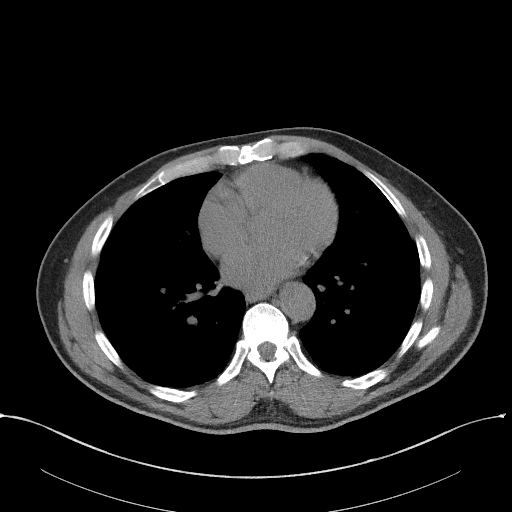

[Series 5: coronal st · coronal · 0.70mm/px · 3 of 90 slices shown]
[im 30/90  soft-tissue]
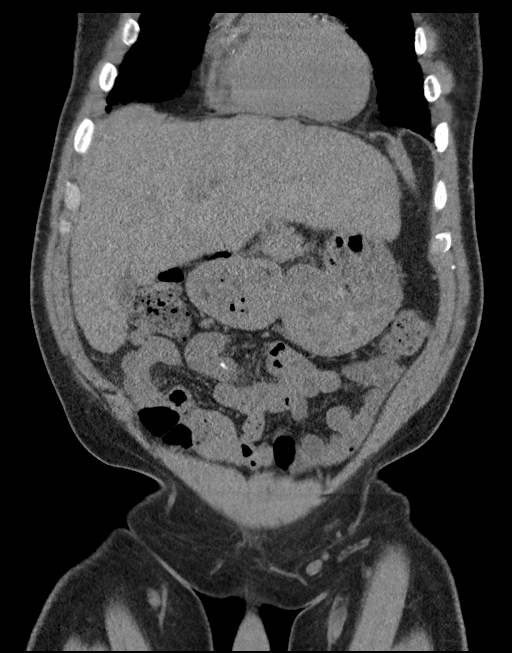
[im 40/90  soft-tissue]
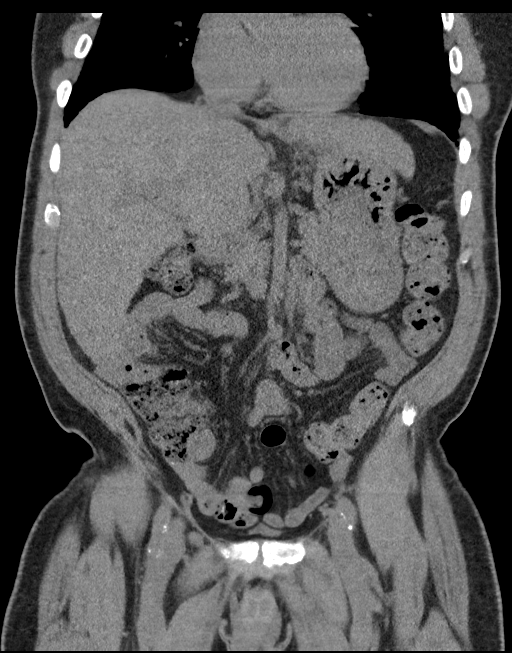
[im 50/90  soft-tissue]
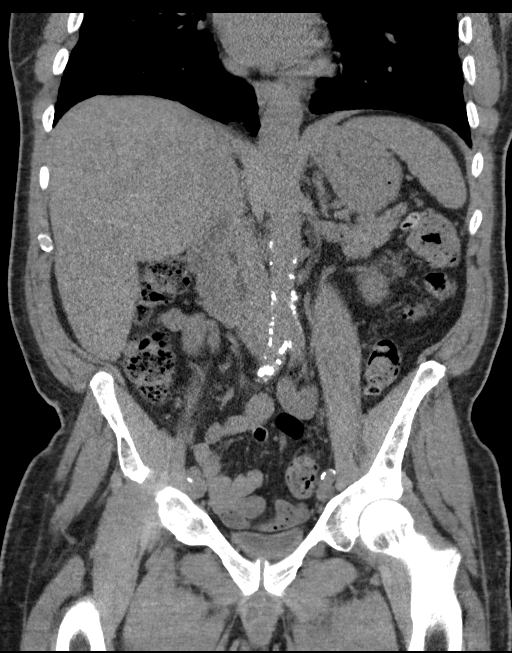

[16 of 46 positions shown; findings below may reference images not displayed]

FINDINGS: The visualized lung bases are clear.

There is a diffusely nodular contour to the liver, compatible with
hepatic cirrhosis. No definite dominant mass is seen, though
evaluation is limited without contrast. The spleen is grossly
unremarkable in appearance. A tiny stone is noted within the
gallbladder. The gallbladder is relatively decompressed and
otherwise unremarkable. The pancreas and adrenal glands are within
normal limits. Scattered mild gastric and splenic varices are noted.

The pancreas and adrenal glands are grossly unremarkable in
appearance.

The kidneys are unremarkable in appearance. There is no evidence of
hydronephrosis. No renal or ureteral stones are seen. No perinephric
stranding is appreciated.

No free fluid is identified. The small bowel is unremarkable in
appearance. The stomach is within normal limits. No acute vascular
abnormalities are seen. Relatively diffuse calcification is noted
along the abdominal aorta and its branches.

The patient is status post appendectomy. The colon is unremarkable
in appearance.

The bladder is mildly distended and grossly unremarkable. The
prostate remains normal in size. No inguinal lymphadenopathy is
seen.

No acute osseous abnormalities are identified. There is mild grade 1
anterolisthesis of L4 on L5, reflecting underlying facet disease.
IMPRESSION: 1. No acute abnormality seen to explain the patient's symptoms.
2. Findings of hepatic cirrhosis.
3. Suggestion of mild gastric and splenic varices.
4. Tiny stone noted within the gallbladder. Gallbladder otherwise
unremarkable.
5. Relatively diffuse calcification along the abdominal aorta and
its branches.
